# Patient Record
Sex: Male | Born: 1955 | Race: White | Hispanic: No | Marital: Married | Smoking: Never smoker
Health system: Southern US, Academic
[De-identification: ages and names within clinical notes are randomized; demographics above are authoritative.]

## PROBLEM LIST (undated history)

## (undated) DIAGNOSIS — E782 Mixed hyperlipidemia: Secondary | ICD-10-CM

## (undated) DIAGNOSIS — F419 Anxiety disorder, unspecified: Secondary | ICD-10-CM

## (undated) DIAGNOSIS — R7309 Other abnormal glucose: Secondary | ICD-10-CM

## (undated) DIAGNOSIS — M1990 Unspecified osteoarthritis, unspecified site: Secondary | ICD-10-CM

## (undated) DIAGNOSIS — M549 Dorsalgia, unspecified: Secondary | ICD-10-CM

## (undated) DIAGNOSIS — F32A Depression, unspecified: Secondary | ICD-10-CM

## (undated) DIAGNOSIS — Z9229 Personal history of other drug therapy: Secondary | ICD-10-CM

## (undated) DIAGNOSIS — G8929 Other chronic pain: Secondary | ICD-10-CM

## (undated) HISTORY — DX: Dorsalgia, unspecified: M54.9

## (undated) HISTORY — DX: Depression, unspecified: F32.A

## (undated) HISTORY — DX: Unspecified osteoarthritis, unspecified site: M19.90

## (undated) HISTORY — DX: Other abnormal glucose: R73.09

## (undated) HISTORY — PX: HX BACK SURGERY: SHX140

## (undated) HISTORY — DX: Personal history of other drug therapy: Z92.29

## (undated) HISTORY — PX: HX KNEE REPLACMENT: SHX125

## (undated) HISTORY — DX: Anxiety disorder, unspecified: F41.9

## (undated) HISTORY — PX: HX CARPAL TUNNEL RELEASE: SHX101

## (undated) HISTORY — DX: Other chronic pain: G89.29

## (undated) HISTORY — DX: Mixed hyperlipidemia: E78.2

## (undated) HISTORY — PX: COLONOSCOPY W/ POLYPECTOMY: SHX1380

## (undated) HISTORY — PX: HX TONSILLECTOMY: SHX27

---

## 2018-05-20 ENCOUNTER — Ambulatory Visit (HOSPITAL_COMMUNITY): Payer: Self-pay | Admitting: Physician Assistant

## 2019-12-08 IMAGING — CR XRAY SHOULDER COMPLETE RT
1 series · 5 of 5 positions shown · non-contrast
Comparison: None available.

﻿EXAM:  XRAY SHOULDER COMPLETE RT
INDICATION: Right shoulder pain.

[Series 1: view not recorded · 0.17mm/px · 5 of 5 slices shown]
[im 1/5]
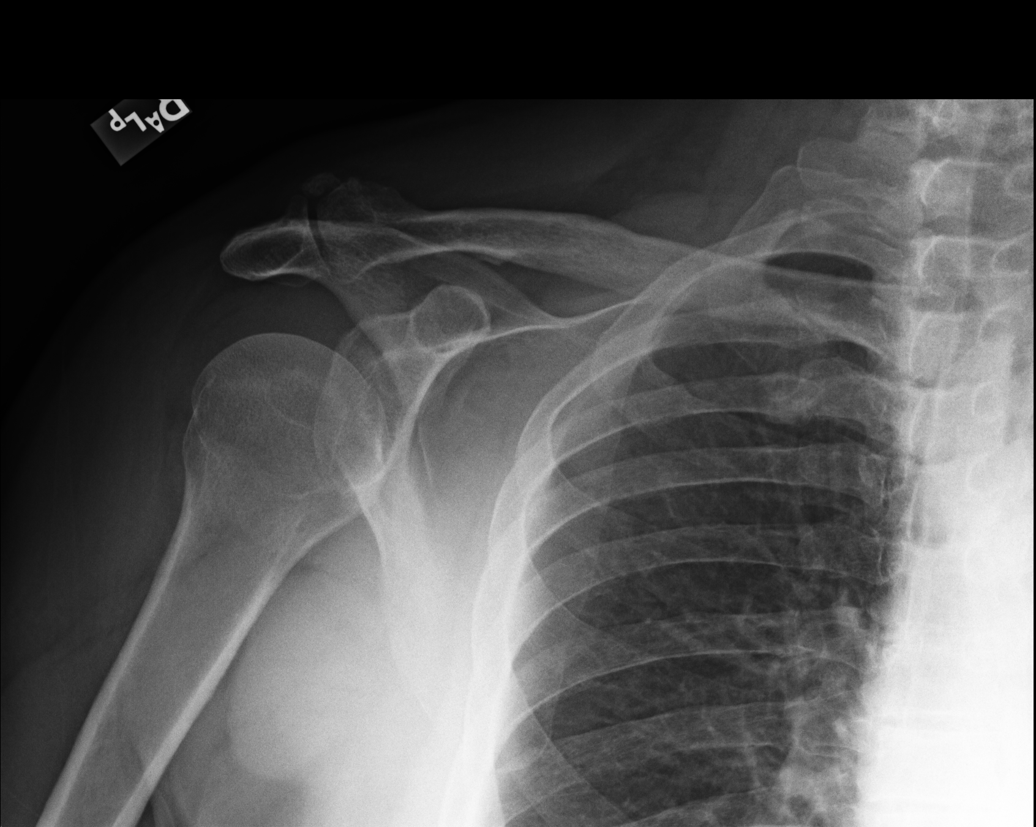
[im 2/5]
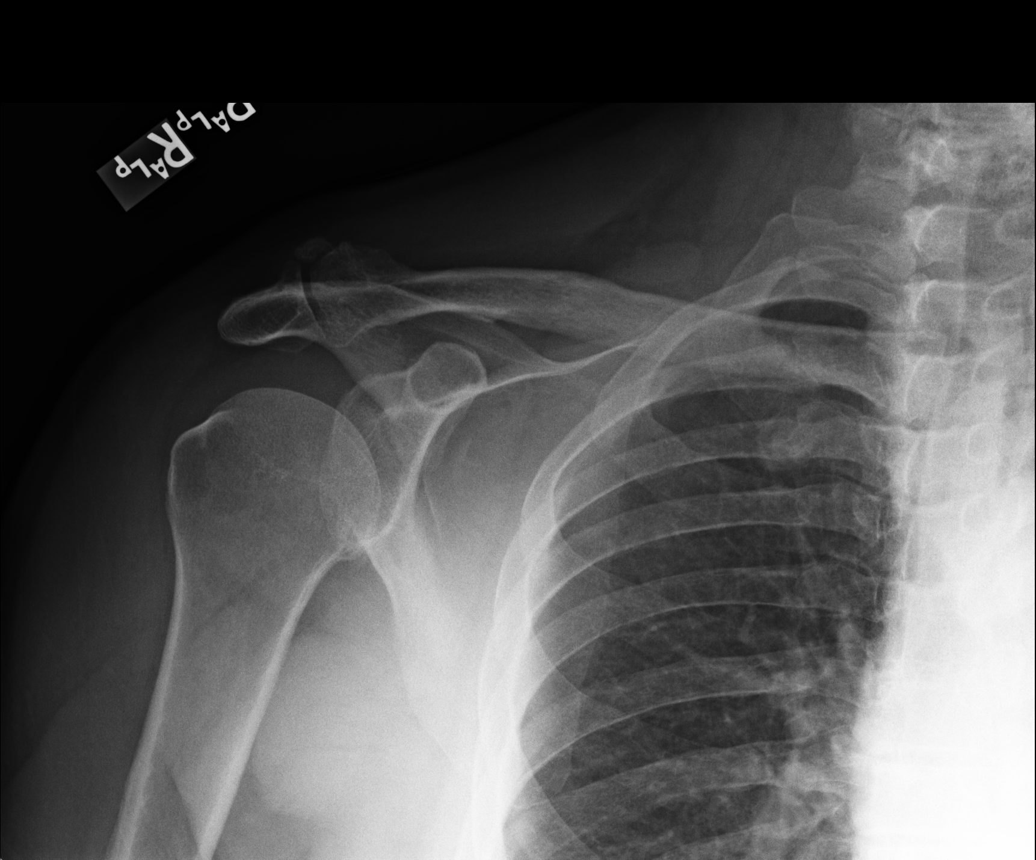
[im 3/5]
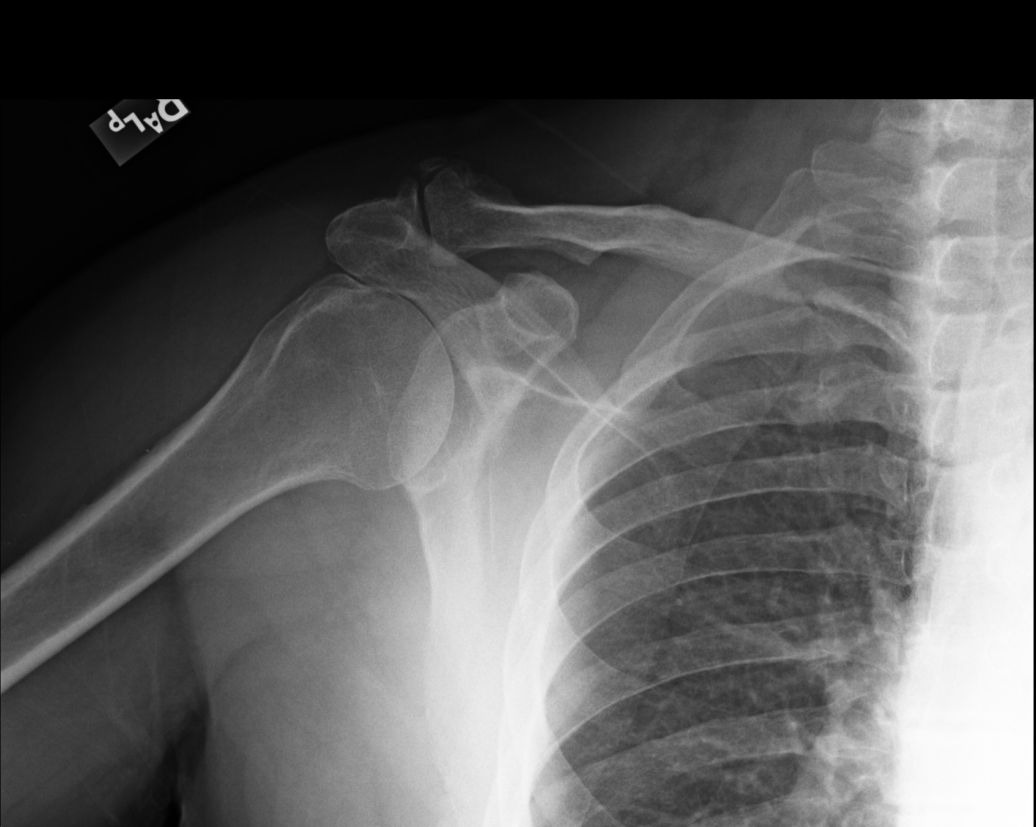
[im 4/5]
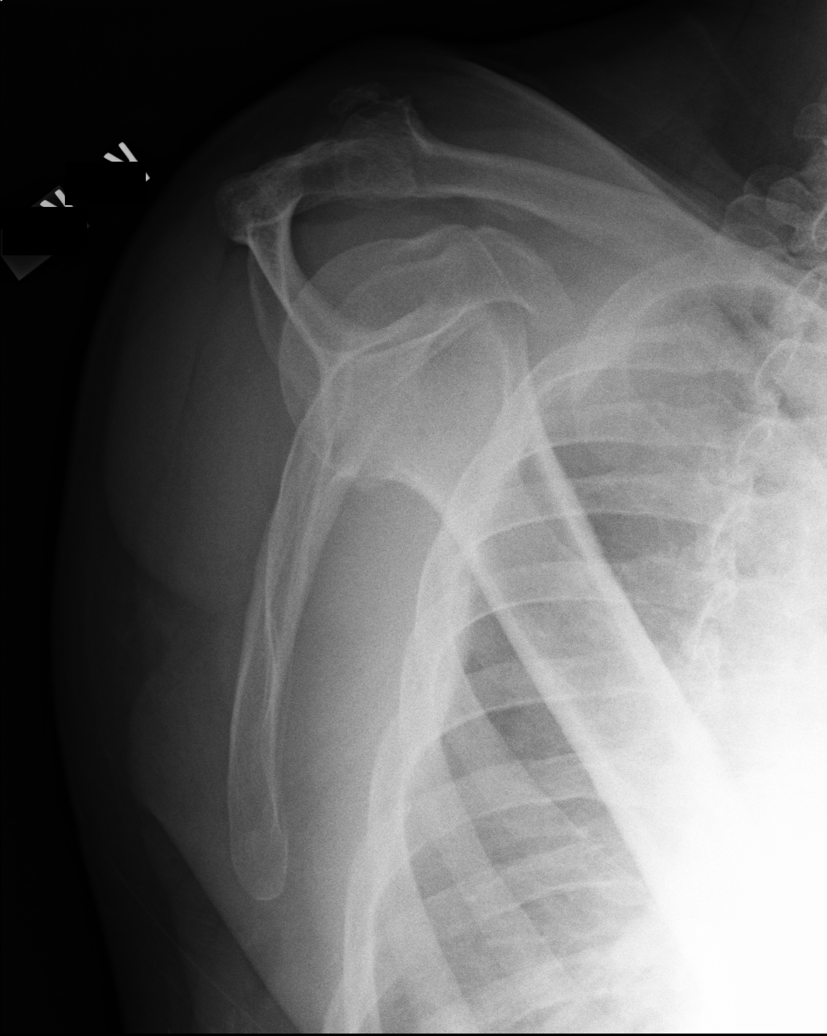
[im 5/5]
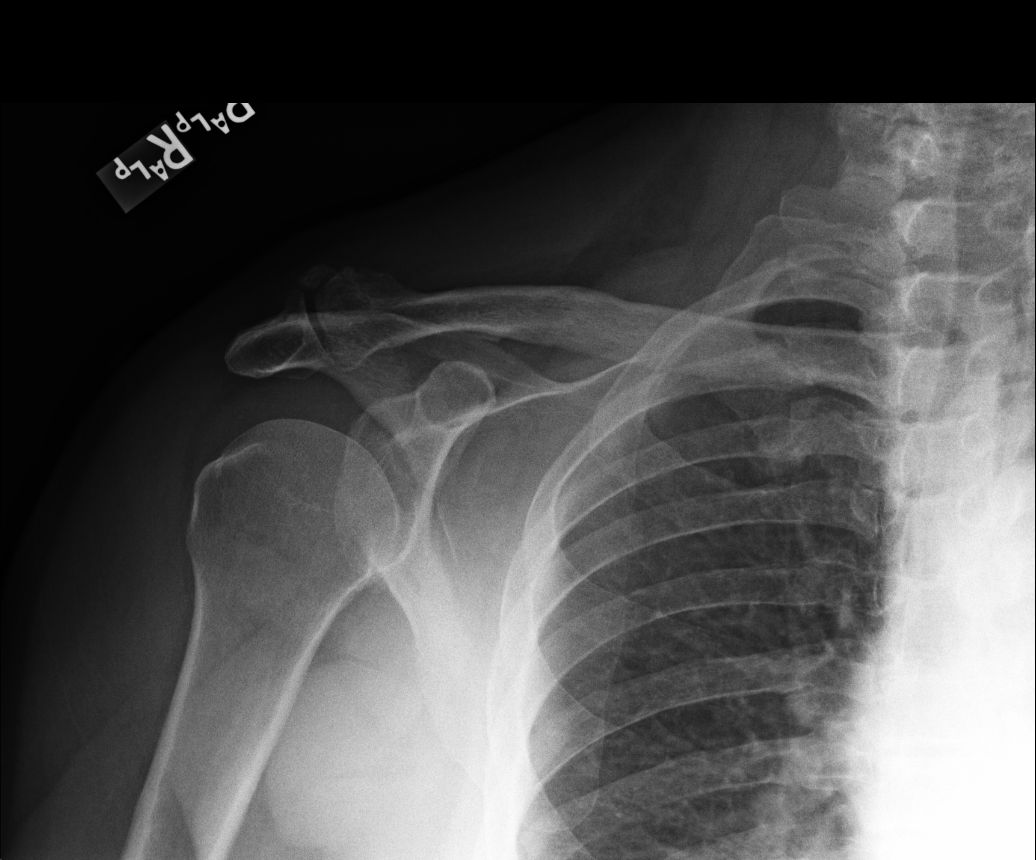

[5 of 5 positions shown; findings below may reference images not displayed]

FINDINGS: There is no acute fracture or subluxation. Glenohumeral articulation is well maintained. There is advanced acromioclavicular joint osteoarthritis. Visualized right lung is clear. Surrounding soft tissues are unremarkable.
IMPRESSION: Advanced acromioclavicular joint osteoarthritis.

## 2021-03-28 IMAGING — MR MRI SHOULDER RT W/O CONTRAST
6 series · 34 of 40 positions shown · IV contrast (gadolinium)
Comparison: Radiographs dated 12/08/2019.

﻿EXAM:  59330   MRI SHOULDER RT W/O CONTRAST
INDICATION: Pain for several months.
TECHNIQUE: Multiplanar multisequential MRI of the right shoulder joint was performed without gadolinium contrast.

[Series 6: T1 · oblique · right · 4.0mm · 0.31mm/px · 7 of 22 slices shown]
[im 1/22]
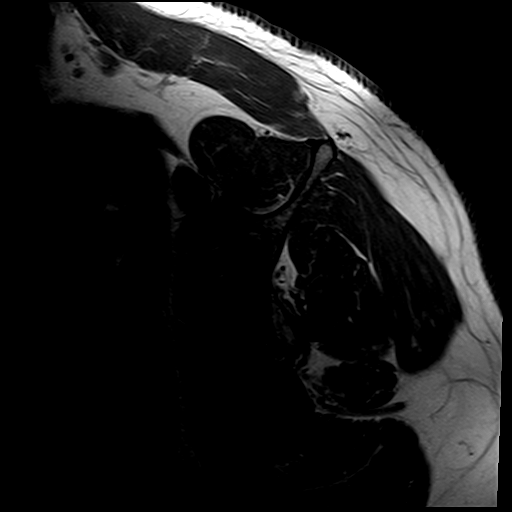
[im 4/22]
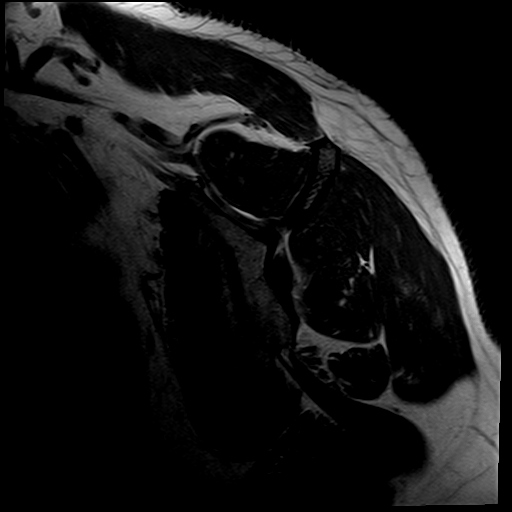
[im 8/22]
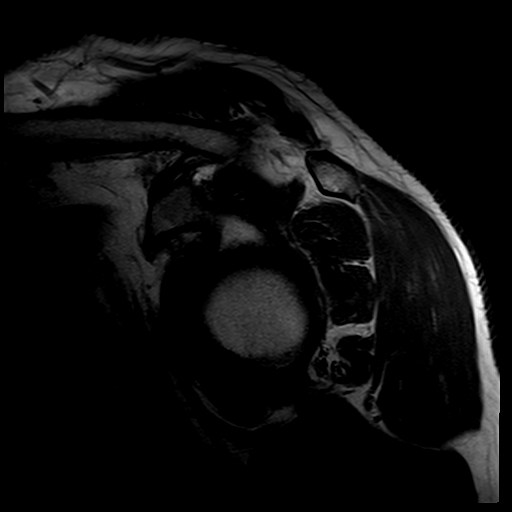
[im 11/22]
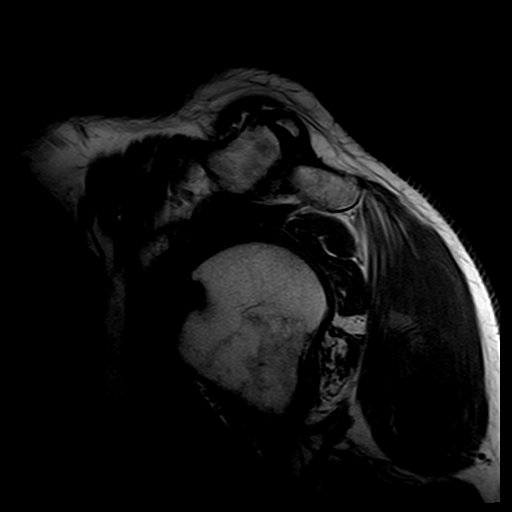
[im 15/22]
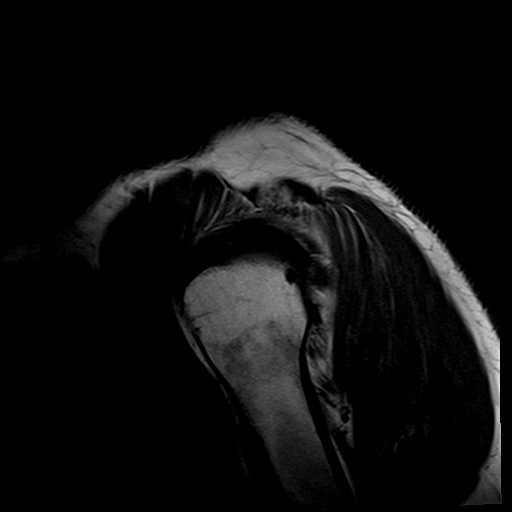
[im 18/22]
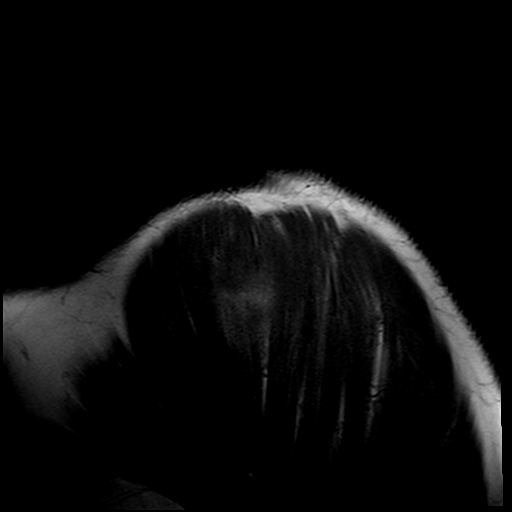
[im 22/22]
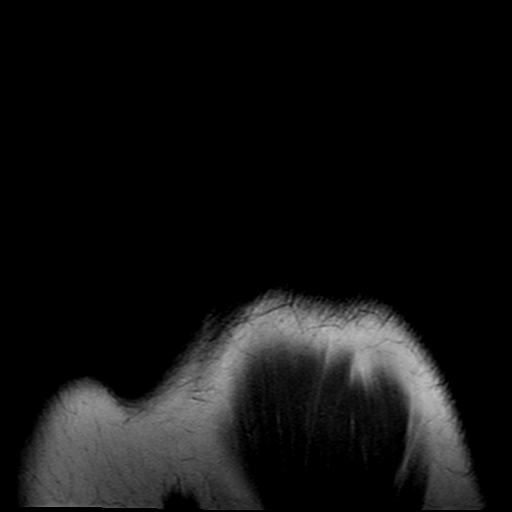

[Series 7: STIR · oblique · right · 3.5mm · 0.47mm/px · 6 of 18 slices shown]
[im 1/18]
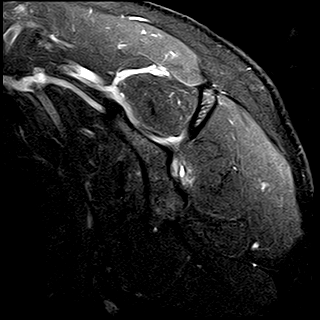
[im 4/18]
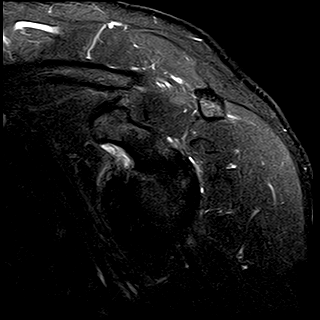
[im 7/18]
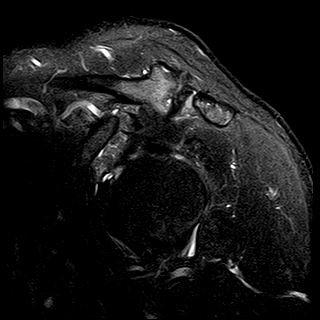
[im 11/18]
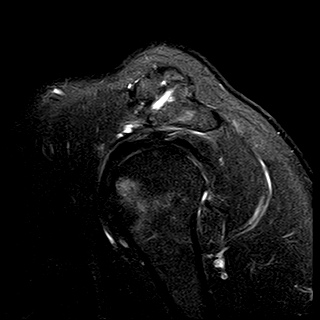
[im 14/18]
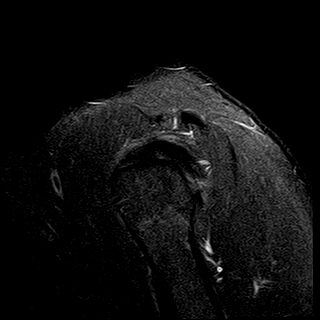
[im 18/18]
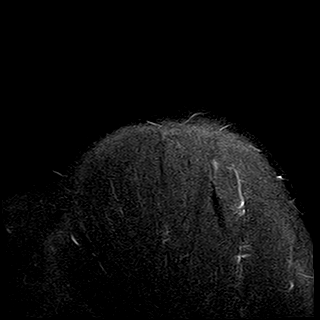

[Series 8: PD fat-sat · axial · right · 4.5mm · 0.50mm/px · z∈[-84,+1]mm · 6 of 18 slices shown (1 of 2)]
[im 1/18]
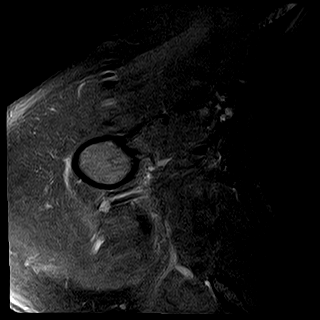
[im 4/18]
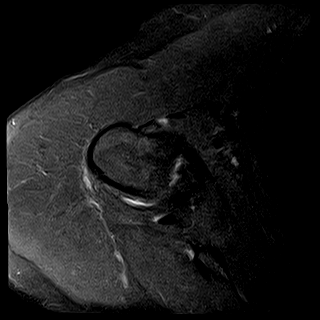
[im 7/18]
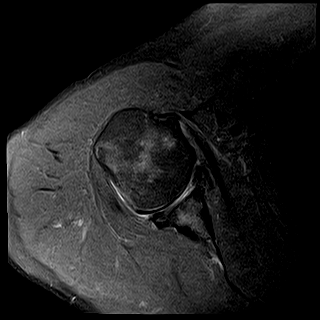
[im 11/18]
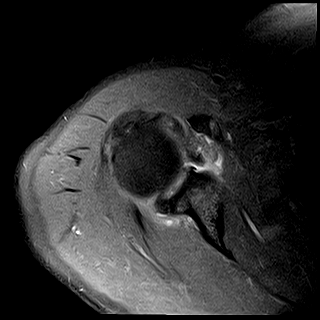
[im 14/18]
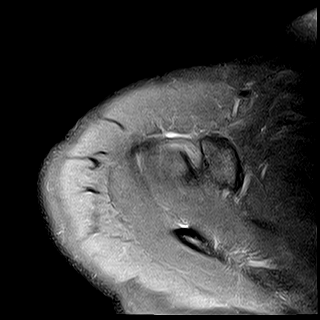
[im 18/18]
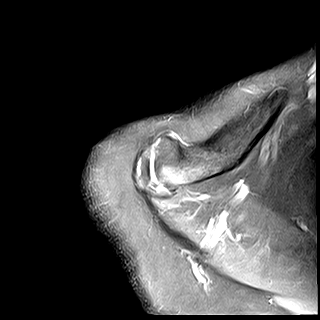

[Series 9: T2 fat-sat · axial · right · 4.0mm · 0.42mm/px · z∈[-94,+10]mm · 8 of 24 slices shown]
[im 1/24]
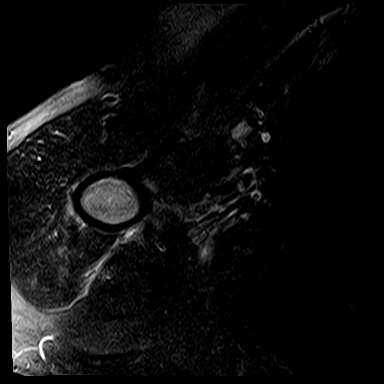
[im 4/24]
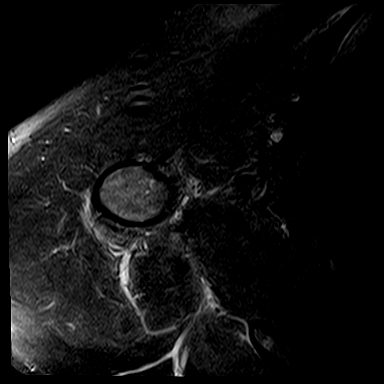
[im 7/24]
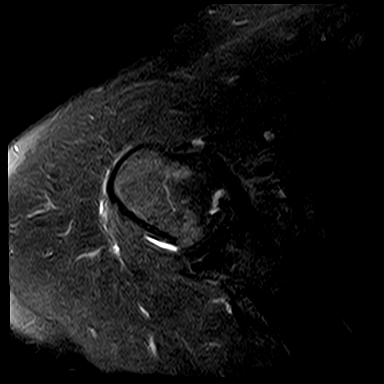
[im 10/24]
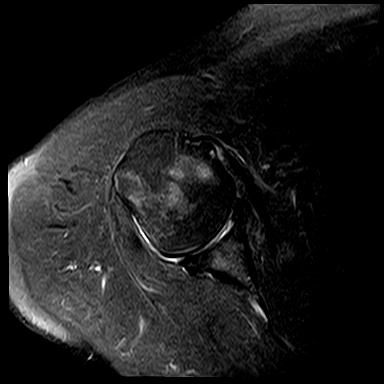
[im 14/24]
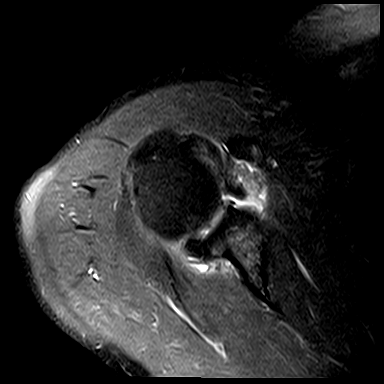
[im 17/24]
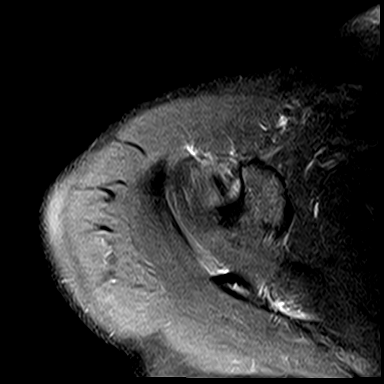
[im 20/24]
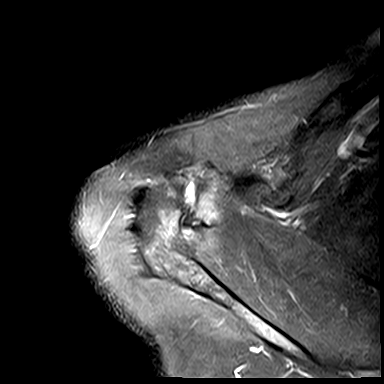
[im 24/24]
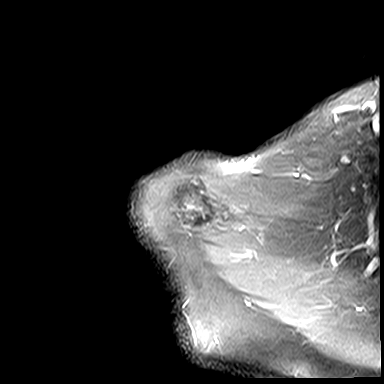

[Series 11: PD fat-sat · oblique · right · 3.5mm · 0.47mm/px · 6 of 18 slices shown (2 of 2)]
[im 1/18]
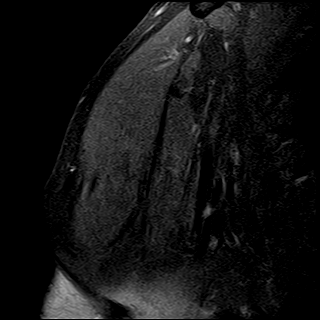
[im 4/18]
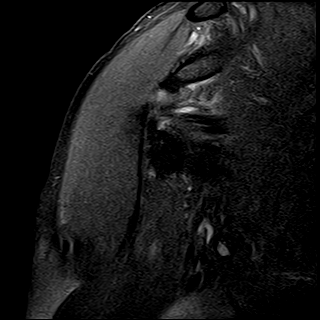
[im 7/18]
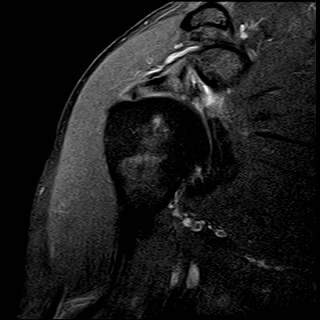
[im 11/18]
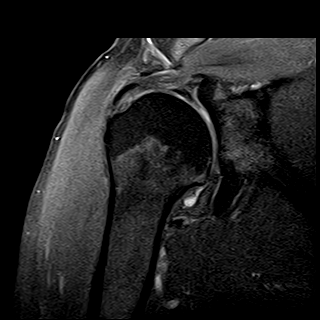
[im 14/18]
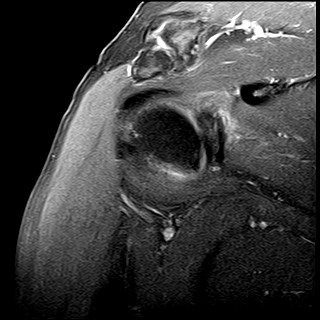
[im 18/18]
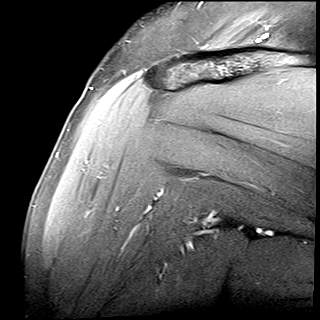

[Series 12: cor (id) · oblique · right · 4.0mm · 0.50mm/px · 1 of 23 slices shown]
[im 1/23]
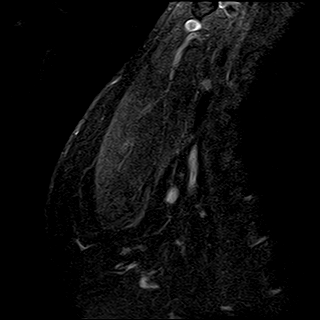

[34 of 40 positions shown; findings below may reference images not displayed]

FINDINGS: There is mild supraspinatus tendinopathy. Infraspinatus, teres minor and subscapularis muscles and tendons are within normal limits in morphology and signal intensity. Long head of biceps tendon is chronically torn and retracted. There are complex superior and posterior labral tears. There is mild glenohumeral articular cartilage thinning. There is advanced acromioclavicular joint osteoarthritis. There is trace fluid within the subacromial/subdeltoid bursa. Muscle bulk and bone marrow signal intensity are normal. No mass is seen along the course of the suprascapular nerve, within the spinoglenoid notch or within the quadrilateral space.
IMPRESSION: 1. Chronically torn and retracted long head of biceps tendon. 

2. Complex superior and posterior labral tears. 

3. Mild supraspinatus tendinopathy. 

4. Advanced acromioclavicular joint osteoarthritis.

## 2021-04-14 ENCOUNTER — Encounter (RURAL_HEALTH_CENTER): Payer: Self-pay | Admitting: Physician Assistant

## 2021-04-14 DIAGNOSIS — R7309 Other abnormal glucose: Secondary | ICD-10-CM | POA: Insufficient documentation

## 2021-04-14 DIAGNOSIS — E782 Mixed hyperlipidemia: Secondary | ICD-10-CM | POA: Insufficient documentation

## 2021-04-14 DIAGNOSIS — M1990 Unspecified osteoarthritis, unspecified site: Secondary | ICD-10-CM | POA: Insufficient documentation

## 2021-04-14 DIAGNOSIS — M159 Polyosteoarthritis, unspecified: Secondary | ICD-10-CM

## 2021-04-14 DIAGNOSIS — F419 Anxiety disorder, unspecified: Secondary | ICD-10-CM | POA: Insufficient documentation

## 2021-05-18 ENCOUNTER — Other Ambulatory Visit (RURAL_HEALTH_CENTER): Payer: Self-pay

## 2021-05-18 ENCOUNTER — Other Ambulatory Visit: Payer: Self-pay

## 2021-05-18 ENCOUNTER — Encounter (RURAL_HEALTH_CENTER): Payer: Self-pay | Admitting: Physician Assistant

## 2021-05-18 ENCOUNTER — Ambulatory Visit (RURAL_HEALTH_CENTER): Payer: Medicare PPO | Attending: Physician Assistant | Admitting: Physician Assistant

## 2021-05-18 VITALS — BP 120/80 | HR 77 | Temp 98.1°F | Ht 65.0 in | Wt 181.0 lb

## 2021-05-18 DIAGNOSIS — M549 Dorsalgia, unspecified: Secondary | ICD-10-CM | POA: Insufficient documentation

## 2021-05-18 DIAGNOSIS — Z79899 Other long term (current) drug therapy: Secondary | ICD-10-CM | POA: Insufficient documentation

## 2021-05-18 DIAGNOSIS — G8929 Other chronic pain: Secondary | ICD-10-CM | POA: Insufficient documentation

## 2021-05-18 DIAGNOSIS — R059 Cough, unspecified: Secondary | ICD-10-CM | POA: Insufficient documentation

## 2021-05-18 DIAGNOSIS — R0981 Nasal congestion: Secondary | ICD-10-CM | POA: Insufficient documentation

## 2021-05-18 DIAGNOSIS — M1990 Unspecified osteoarthritis, unspecified site: Secondary | ICD-10-CM | POA: Insufficient documentation

## 2021-05-18 DIAGNOSIS — F32A Depression, unspecified: Secondary | ICD-10-CM | POA: Insufficient documentation

## 2021-05-18 DIAGNOSIS — M159 Polyosteoarthritis, unspecified: Secondary | ICD-10-CM

## 2021-05-18 DIAGNOSIS — M545 Low back pain, unspecified: Secondary | ICD-10-CM

## 2021-05-18 DIAGNOSIS — F419 Anxiety disorder, unspecified: Secondary | ICD-10-CM | POA: Insufficient documentation

## 2021-05-18 DIAGNOSIS — I10 Essential (primary) hypertension: Secondary | ICD-10-CM | POA: Insufficient documentation

## 2021-05-18 MED ORDER — AZITHROMYCIN 250 MG TABLET
ORAL_TABLET | ORAL | 0 refills | Status: DC
Start: 2021-05-18 — End: 2021-07-18

## 2021-05-18 MED ORDER — SIMVASTATIN 40 MG TABLET
40.0000 mg | ORAL_TABLET | Freq: Every evening | ORAL | 1 refills | Status: DC
Start: 2021-05-18 — End: 2021-06-21

## 2021-05-18 MED ORDER — HYDROCODONE 7.5 MG-ACETAMINOPHEN 325 MG TABLET
1.0000 | ORAL_TABLET | Freq: Three times a day (TID) | ORAL | 0 refills | Status: DC | PRN
Start: 2021-05-18 — End: 2021-06-21

## 2021-05-18 MED ORDER — LISINOPRIL 10 MG TABLET
10.0000 mg | ORAL_TABLET | Freq: Every day | ORAL | 1 refills | Status: DC
Start: 2021-05-18 — End: 2021-08-22

## 2021-05-18 MED ORDER — TRIAMCINOLONE ACETONIDE 40 MG/ML SUSPENSION FOR INJECTION
40.0000 mg | INTRAMUSCULAR | Status: AC
Start: 2021-05-18 — End: 2021-05-18
  Administered 2021-05-18: 40 mg via INTRAMUSCULAR

## 2021-05-18 NOTE — Nursing Note (Signed)
Patient is here for 1 month follow up with medication refills.
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
## 2021-05-26 ENCOUNTER — Other Ambulatory Visit: Payer: Self-pay

## 2021-05-26 ENCOUNTER — Ambulatory Visit (RURAL_HEALTH_CENTER): Payer: Medicare HMO | Attending: Physician Assistant | Admitting: Physician Assistant

## 2021-05-26 DIAGNOSIS — Z0000 Encounter for general adult medical examination without abnormal findings: Secondary | ICD-10-CM

## 2021-05-26 DIAGNOSIS — Z538 Procedure and treatment not carried out for other reasons: Secondary | ICD-10-CM | POA: Insufficient documentation

## 2021-06-01 DIAGNOSIS — S46211A Strain of muscle, fascia and tendon of other parts of biceps, right arm, initial encounter: Secondary | ICD-10-CM | POA: Insufficient documentation

## 2021-06-21 ENCOUNTER — Other Ambulatory Visit: Payer: Self-pay

## 2021-06-21 ENCOUNTER — Ambulatory Visit (RURAL_HEALTH_CENTER): Payer: Medicare HMO | Attending: Physician Assistant | Admitting: Physician Assistant

## 2021-06-21 ENCOUNTER — Encounter (RURAL_HEALTH_CENTER): Payer: Self-pay | Admitting: Physician Assistant

## 2021-06-21 VITALS — BP 124/78 | HR 73 | Temp 98.7°F | Ht 64.0 in | Wt 178.0 lb

## 2021-06-21 DIAGNOSIS — G479 Sleep disorder, unspecified: Secondary | ICD-10-CM | POA: Insufficient documentation

## 2021-06-21 DIAGNOSIS — M549 Dorsalgia, unspecified: Secondary | ICD-10-CM | POA: Insufficient documentation

## 2021-06-21 DIAGNOSIS — G8929 Other chronic pain: Secondary | ICD-10-CM | POA: Insufficient documentation

## 2021-06-21 DIAGNOSIS — F419 Anxiety disorder, unspecified: Secondary | ICD-10-CM | POA: Insufficient documentation

## 2021-06-21 DIAGNOSIS — I10 Essential (primary) hypertension: Secondary | ICD-10-CM | POA: Insufficient documentation

## 2021-06-21 DIAGNOSIS — F515 Nightmare disorder: Secondary | ICD-10-CM | POA: Insufficient documentation

## 2021-06-21 MED ORDER — CLONAZEPAM 1 MG TABLET
1.0000 mg | ORAL_TABLET | Freq: Every evening | ORAL | 2 refills | Status: DC
Start: 2021-06-21 — End: 2021-07-18

## 2021-06-21 MED ORDER — SIMVASTATIN 40 MG TABLET
40.0000 mg | ORAL_TABLET | Freq: Every evening | ORAL | 1 refills | Status: DC
Start: 2021-06-21 — End: 2021-10-17

## 2021-06-21 MED ORDER — HYDROCODONE 7.5 MG-ACETAMINOPHEN 325 MG TABLET
1.0000 | ORAL_TABLET | Freq: Three times a day (TID) | ORAL | 0 refills | Status: DC | PRN
Start: 2021-06-21 — End: 2021-07-18

## 2021-06-21 NOTE — Nursing Note (Signed)
Patient here today for refills. Patient voiced no new problems.
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
## 2021-06-26 NOTE — Progress Notes (Signed)
[facility]  INTERNAL MEDICINE, [facility]  Operated by [facility]  Progress Note    Name: Gregory Case MRN:  [id number]   Date: 05/26/2021 Age: 66 y.o.         Encounter Date: 05/26/2021   PCP: Eyvonne Mechanic, PA-C     PT WAS A NO SHOW FOR THIS VISIT  NO CHARGE        SUBJECTIVE:   Gregory Case is a 66 y.o. male for presenting for annual Medicare Wellness Visit.          I have reviewed and updated as appropriate the past medical, family and social history. 05/26/2021 as summarized below:  Past Medical History:   Diagnosis Date   . Abnormal glucose level    . Anxiety    . Chronic back pain    . COVID-19 vaccine series completed    . Depression    . Mixed hyperlipidemia    . Osteoarthritis      Past Surgical History:   Procedure Laterality Date   . Colonoscopy w/ polypectomy     . Hx back surgery     . Hx carpal tunnel release     . Hx knee replacment Right    . Hx tonsillectomy       Current Outpatient Medications   Medication Sig   . azithromycin (ZITHROMAX) 250 mg Oral Tablet Take 500 mg (2 tab) on day 1; take 250 mg (1 tab) on days 2-5. (Patient not taking: Reported on 06/21/2021)   . clonazePAM (KLONOPIN) 1 mg Oral Tablet Take 1 Tablet (1 mg total) by mouth Every night   . FLUoxetine (PROZAC) 10 mg Oral Capsule Take 1 Capsule (10 mg total) by mouth Once a day   . HYDROcodone-acetaminophen (NORCO) 7.5-325 mg Oral Tablet Take 1 Tablet by mouth Three times a day as needed for Pain for up to 30 days   . lisinopriL (PRINIVIL) 10 mg Oral Tablet Take 1 Tablet (10 mg total) by mouth Once a day   . omega 3-dha-epa-fish oil 300 mg (120 mg- 180mg )-1,000 mg Oral Capsule Take 1 Capsule by mouth Once a day   . simvastatin (ZOCOR) 40 mg Oral Tablet Take 1 Tablet (40 mg total) by mouth Every night     Family Medical History:     Problem Relation (Age of Onset)    Coronary Artery Disease Mother    Diabetes type II Mother    Hypertension (High Blood Pressure) Other           Social History     Socioeconomic History   . Marital status: Married   Tobacco Use   . Smoking status: Never   . Smokeless tobacco: Never   Substance and Sexual Activity   . Alcohol use: Yes     Comment: few times a week   . Drug use: Never   . Sexual activity: Yes     Partners: Female        List of Current Health Care Providers   Care Team     PCP     Name Type Specialty Phone Number    , Eyvonne Mechanic Physician Assistant PHYSICIAN ASSISTANT [phone]          Care Team     No care team found                  Health Maintenance   Topic Date Due   . Hepatitis C screening  Never done   . Shingles Vaccine (1 of 2) Never done   . Pneumococcal Vaccination, Age 32+ (1 - PCV) Never done   . Adult Tdap-Td (2 - Td or Tdap) 07/17/2023   . Colonoscopy  06/29/2029   . Influenza Vaccine  Completed   . Covid-19 Vaccine  Completed   . Meningococcal Vaccine  Aged Out   . Depression Screening  Discontinued     Medicare Wellness Assessment      Advance Directives (optional)                  Activities of Daily Living                   Urinary Incontinence Screen (Women >=65 only)       Cognitive Function Screen (1=Yes, 0=No)                                                   Hearing Screen       Fall Risk Screen       Vision Screen           Depression Screen       Pain Score        Substance Use-Abuse Screening   Tobacco Use       Alcohol use       Prescription Drug Use             Illicit Drug Use                ROS:  ROS     OBJECTIVE:   There were no vitals filed for this visit.        PHYSICAL EXAM:  Physical Exam    ASSESSMENT/PLAN:      Identified Risk Factors/ Recommended Actions   No diagnosis found.              Opioid use plan of care:         Opioids use:       Written Prevention Plan reviewed and shared in writing with the patient (See Patient Instructions in After Visit Summary).       The patient has been educated about risk factors and recommended preventive care. Written Prevention Plan completed/ updated  and given to patient (see After Visit Summary).    Follow up: No follow-ups on file.    Rosalio Loud    [facility] Primary [facility]      This note was partially created using MModal Fluency Direct system (voice recognition software) and is inherently subject to errors including those of syntax and "sound-alike" substitutions which may escape proofreading.  In such instances, original meaning may be extrapolated by contextual derivation.

## 2021-07-18 ENCOUNTER — Ambulatory Visit (RURAL_HEALTH_CENTER): Payer: Medicare HMO | Attending: Physician Assistant | Admitting: Physician Assistant

## 2021-07-18 ENCOUNTER — Other Ambulatory Visit: Payer: Self-pay

## 2021-07-18 ENCOUNTER — Encounter (RURAL_HEALTH_CENTER): Payer: Self-pay | Admitting: Physician Assistant

## 2021-07-18 VITALS — BP 132/76 | HR 67 | Temp 96.6°F | Ht 64.0 in | Wt 179.0 lb

## 2021-07-18 DIAGNOSIS — G8929 Other chronic pain: Secondary | ICD-10-CM | POA: Insufficient documentation

## 2021-07-18 DIAGNOSIS — I10 Essential (primary) hypertension: Secondary | ICD-10-CM | POA: Insufficient documentation

## 2021-07-18 DIAGNOSIS — R7309 Other abnormal glucose: Secondary | ICD-10-CM | POA: Insufficient documentation

## 2021-07-18 DIAGNOSIS — M545 Low back pain, unspecified: Secondary | ICD-10-CM | POA: Insufficient documentation

## 2021-07-18 DIAGNOSIS — F419 Anxiety disorder, unspecified: Secondary | ICD-10-CM | POA: Insufficient documentation

## 2021-07-18 DIAGNOSIS — G478 Other sleep disorders: Secondary | ICD-10-CM | POA: Insufficient documentation

## 2021-07-18 MED ORDER — FLUOXETINE 10 MG CAPSULE
10.0000 mg | ORAL_CAPSULE | Freq: Every day | ORAL | 1 refills | Status: DC
Start: 2021-07-18 — End: 2021-08-17

## 2021-07-18 MED ORDER — CLONAZEPAM 1 MG TABLET
1.0000 mg | ORAL_TABLET | Freq: Every evening | ORAL | 2 refills | Status: DC
Start: 2021-07-18 — End: 2021-08-17

## 2021-07-18 MED ORDER — HYDROCODONE 7.5 MG-ACETAMINOPHEN 325 MG TABLET
1.0000 | ORAL_TABLET | Freq: Three times a day (TID) | ORAL | 0 refills | Status: DC | PRN
Start: 2021-07-18 — End: 2021-08-17

## 2021-07-18 NOTE — Nursing Note (Signed)
Patient is here for routine 1 month follow up for medication refills.
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
## 2021-08-15 ENCOUNTER — Ambulatory Visit (RURAL_HEALTH_CENTER): Payer: Self-pay | Admitting: Physician Assistant

## 2021-08-17 ENCOUNTER — Other Ambulatory Visit: Payer: Self-pay

## 2021-08-17 ENCOUNTER — Other Ambulatory Visit (INDEPENDENT_AMBULATORY_CARE_PROVIDER_SITE_OTHER): Payer: Self-pay | Admitting: Physician Assistant

## 2021-08-17 ENCOUNTER — Encounter (INDEPENDENT_AMBULATORY_CARE_PROVIDER_SITE_OTHER): Payer: Self-pay | Admitting: Physician Assistant

## 2021-08-17 ENCOUNTER — Ambulatory Visit: Payer: Medicare (Managed Care) | Attending: Physician Assistant | Admitting: Physician Assistant

## 2021-08-17 VITALS — BP 138/88 | HR 52 | Temp 97.2°F | Ht 64.0 in | Wt 171.0 lb

## 2021-08-17 DIAGNOSIS — M545 Low back pain, unspecified: Secondary | ICD-10-CM | POA: Insufficient documentation

## 2021-08-17 DIAGNOSIS — M25512 Pain in left shoulder: Secondary | ICD-10-CM | POA: Insufficient documentation

## 2021-08-17 DIAGNOSIS — M159 Polyosteoarthritis, unspecified: Secondary | ICD-10-CM | POA: Insufficient documentation

## 2021-08-17 DIAGNOSIS — I10 Essential (primary) hypertension: Secondary | ICD-10-CM | POA: Insufficient documentation

## 2021-08-17 DIAGNOSIS — R7309 Other abnormal glucose: Secondary | ICD-10-CM | POA: Insufficient documentation

## 2021-08-17 DIAGNOSIS — F419 Anxiety disorder, unspecified: Secondary | ICD-10-CM | POA: Insufficient documentation

## 2021-08-17 DIAGNOSIS — G8929 Other chronic pain: Secondary | ICD-10-CM | POA: Insufficient documentation

## 2021-08-17 DIAGNOSIS — M79642 Pain in left hand: Secondary | ICD-10-CM | POA: Insufficient documentation

## 2021-08-17 LAB — CBC WITH DIFF
BASOPHIL #: 0 10*3/uL (ref 0.00–0.30)
BASOPHIL %: 1 % (ref 0–3)
EOSINOPHIL #: 0.1 10*3/uL (ref 0.00–0.80)
EOSINOPHIL %: 1 % (ref 0–7)
HCT: 43.3 % (ref 42.0–51.0)
HGB: 14.7 g/dL (ref 13.5–18.0)
LYMPHOCYTE #: 2.1 10*3/uL (ref 1.10–5.00)
LYMPHOCYTE %: 28 % (ref 25–45)
MCH: 29.4 pg (ref 27.0–32.0)
MCHC: 34 g/dL (ref 32.0–36.0)
MCV: 86.4 fL (ref 78.0–99.0)
MONOCYTE #: 0.7 10*3/uL (ref 0.00–1.30)
MONOCYTE %: 9 % (ref 0–12)
MPV: 9.7 fL (ref 7.4–10.4)
NEUTROPHIL #: 4.8 10*3/uL (ref 1.80–8.40)
NEUTROPHIL %: 62 % (ref 40–76)
PLATELETS: 219 10*3/uL (ref 140–440)
RBC: 5.01 10*6/uL (ref 4.20–6.00)
RDW: 13.3 % (ref 11.6–14.8)
WBC: 7.7 10*3/uL (ref 4.0–10.5)
WBCS UNCORRECTED: 7.7 10*3/uL

## 2021-08-17 LAB — COMPREHENSIVE METABOLIC PNL, FASTING
ALBUMIN/GLOBULIN RATIO: 1.7 — ABNORMAL HIGH (ref 0.8–1.4)
ALBUMIN: 4.8 g/dL (ref 3.5–5.7)
ALKALINE PHOSPHATASE: 38 U/L (ref 34–104)
ALT (SGPT): 16 U/L (ref 7–52)
ANION GAP: 8 mmol/L — ABNORMAL LOW (ref 10–20)
AST (SGOT): 30 U/L (ref 13–39)
BILIRUBIN TOTAL: 0.6 mg/dL (ref 0.3–1.2)
BUN/CREA RATIO: 8 (ref 6–22)
BUN: 7 mg/dL (ref 7–25)
CALCIUM, CORRECTED: 8.9 mg/dL (ref 8.9–10.8)
CALCIUM: 9.7 mg/dL (ref 8.6–10.3)
CHLORIDE: 104 mmol/L (ref 98–107)
CO2 TOTAL: 26 mmol/L (ref 21–31)
CREATININE: 0.89 mg/dL (ref 0.60–1.30)
ESTIMATED GFR: 94 mL/min/{1.73_m2} (ref >59)
GLOBULIN: 2.9 (ref 2.9–5.4)
GLUCOSE: 96 mg/dL (ref 74–109)
OSMOLALITY, CALCULATED: 274 mOsm/kg (ref 270–290)
POTASSIUM: 4.3 mmol/L (ref 3.5–5.1)
PROTEIN TOTAL: 7.7 g/dL (ref 6.4–8.9)
SODIUM: 138 mmol/L (ref 136–145)

## 2021-08-17 LAB — SEDIMENTATION RATE: ERYTHROCYTE SEDIMENTATION RATE (ESR): 9 mm/hr (ref <20)

## 2021-08-17 LAB — URIC ACID: URIC ACID: 6 mg/dL (ref 2.3–7.6)

## 2021-08-17 MED ORDER — CLONAZEPAM 1 MG TABLET
1.0000 mg | ORAL_TABLET | Freq: Every evening | ORAL | 2 refills | Status: DC
Start: 2021-08-17 — End: 2021-11-14

## 2021-08-17 MED ORDER — FLUOXETINE 10 MG CAPSULE
10.0000 mg | ORAL_CAPSULE | Freq: Every day | ORAL | 1 refills | Status: DC
Start: 2021-08-17 — End: 2022-02-08

## 2021-08-17 MED ORDER — HYDROCODONE 7.5 MG-ACETAMINOPHEN 325 MG TABLET
1.0000 | ORAL_TABLET | Freq: Three times a day (TID) | ORAL | 0 refills | Status: DC | PRN
Start: 2021-08-17 — End: 2021-09-14

## 2021-08-17 MED ORDER — TRIAMCINOLONE ACETONIDE 40 MG/ML SUSPENSION FOR INJECTION
40.0000 mg | INTRAMUSCULAR | Status: AC
Start: 2021-08-17 — End: 2021-08-17
  Administered 2021-08-17: 40 mg via INTRAMUSCULAR

## 2021-08-17 MED ORDER — DICLOFENAC 20 MG/GRAM/ACTUATION (2 %) TOPICAL SOLN METERED-DOSE PUMP
1.0000 | Freq: Two times a day (BID) | CUTANEOUS | 3 refills | Status: DC
Start: 2021-08-17 — End: 2021-09-14
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
## 2021-08-17 NOTE — Nursing Note (Signed)
Patient is here for routine 1 month follow up for medication refills.

## 2021-08-17 NOTE — Telephone Encounter (Signed)
Patient came in today for routine 1 month follow up for medication refill.

## 2021-08-18 ENCOUNTER — Encounter (INDEPENDENT_AMBULATORY_CARE_PROVIDER_SITE_OTHER): Payer: Medicare (Managed Care) | Admitting: Physician Assistant

## 2021-08-18 ENCOUNTER — Ambulatory Visit (RURAL_HEALTH_CENTER): Payer: Self-pay | Admitting: Physician Assistant

## 2021-08-18 LAB — HGA1C (HEMOGLOBIN A1C WITH EST AVG GLUCOSE): HEMOGLOBIN A1C: 5.7 % (ref 4.0–6.0)

## 2021-08-19 ENCOUNTER — Ambulatory Visit (INDEPENDENT_AMBULATORY_CARE_PROVIDER_SITE_OTHER): Payer: Medicare (Managed Care) | Admitting: Physician Assistant

## 2021-08-19 LAB — HEP-2 SUBSTRATE ANTINUCLEAR ANTIBODIES (ANA), SERUM: ANA INTERPRETATION: NEGATIVE

## 2021-08-19 LAB — RHEUMATOID FACTOR, SERUM: RHEUMATOID FACTOR: <13 IU/mL (ref <=30)

## 2021-08-22 ENCOUNTER — Other Ambulatory Visit (INDEPENDENT_AMBULATORY_CARE_PROVIDER_SITE_OTHER): Payer: Self-pay | Admitting: Physician Assistant

## 2021-08-22 ENCOUNTER — Other Ambulatory Visit (RURAL_HEALTH_CENTER): Payer: Self-pay | Admitting: Physician Assistant

## 2021-08-22 MED ORDER — TRIAMCINOLONE ACETONIDE 0.1 % TOPICAL CREAM
TOPICAL_CREAM | Freq: Two times a day (BID) | CUTANEOUS | 1 refills | Status: AC
Start: 2021-08-22 — End: 2021-08-29

## 2021-08-22 MED ORDER — METHYLPREDNISOLONE 4 MG TABLETS IN A DOSE PACK
ORAL_TABLET | ORAL | 0 refills | Status: DC
Start: 2021-08-22 — End: 2021-09-14

## 2021-08-22 MED ORDER — LISINOPRIL 10 MG TABLET
10.0000 mg | ORAL_TABLET | Freq: Every day | ORAL | 1 refills | Status: DC
Start: 2021-08-22 — End: 2021-12-12

## 2021-09-12 ENCOUNTER — Ambulatory Visit (RURAL_HEALTH_CENTER): Payer: Self-pay | Admitting: Physician Assistant

## 2021-09-14 ENCOUNTER — Other Ambulatory Visit (INDEPENDENT_AMBULATORY_CARE_PROVIDER_SITE_OTHER): Payer: Self-pay | Admitting: Physician Assistant

## 2021-09-14 ENCOUNTER — Encounter (INDEPENDENT_AMBULATORY_CARE_PROVIDER_SITE_OTHER): Payer: Self-pay | Admitting: Physician Assistant

## 2021-09-14 ENCOUNTER — Ambulatory Visit: Payer: Medicare HMO | Attending: Physician Assistant | Admitting: Physician Assistant

## 2021-09-14 ENCOUNTER — Other Ambulatory Visit: Payer: Self-pay

## 2021-09-14 VITALS — BP 114/82 | HR 67 | Temp 97.2°F | Ht 64.0 in | Wt 173.0 lb

## 2021-09-14 DIAGNOSIS — G8929 Other chronic pain: Secondary | ICD-10-CM

## 2021-09-14 DIAGNOSIS — F32A Depression, unspecified: Secondary | ICD-10-CM

## 2021-09-14 DIAGNOSIS — F419 Anxiety disorder, unspecified: Secondary | ICD-10-CM

## 2021-09-14 DIAGNOSIS — I10 Essential (primary) hypertension: Secondary | ICD-10-CM

## 2021-09-14 DIAGNOSIS — M545 Low back pain, unspecified: Secondary | ICD-10-CM

## 2021-09-14 NOTE — Telephone Encounter (Signed)
Patient is in office for routine 1 month follow up for medication refills.
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
## 2021-09-14 NOTE — Nursing Note (Signed)
Patient is here for routine 1 month follow up for medication refills.

## 2021-09-15 ENCOUNTER — Ambulatory Visit (RURAL_HEALTH_CENTER): Payer: Self-pay | Admitting: Physician Assistant

## 2021-09-15 MED ORDER — HYDROCODONE 7.5 MG-ACETAMINOPHEN 325 MG TABLET
1.0000 | ORAL_TABLET | Freq: Three times a day (TID) | ORAL | 0 refills | Status: DC | PRN
Start: 2021-09-15 — End: 2021-10-17

## 2021-09-16 ENCOUNTER — Ambulatory Visit (INDEPENDENT_AMBULATORY_CARE_PROVIDER_SITE_OTHER): Payer: BC Managed Care – PPO | Admitting: Physician Assistant

## 2021-09-22 DIAGNOSIS — F32A Depression, unspecified: Secondary | ICD-10-CM | POA: Insufficient documentation

## 2021-10-10 ENCOUNTER — Ambulatory Visit (RURAL_HEALTH_CENTER): Payer: Self-pay | Admitting: Physician Assistant

## 2021-10-13 ENCOUNTER — Ambulatory Visit (RURAL_HEALTH_CENTER): Payer: Self-pay | Admitting: Physician Assistant

## 2021-10-13 ENCOUNTER — Encounter (INDEPENDENT_AMBULATORY_CARE_PROVIDER_SITE_OTHER): Payer: BC Managed Care – PPO | Admitting: Physician Assistant

## 2021-10-14 ENCOUNTER — Ambulatory Visit (INDEPENDENT_AMBULATORY_CARE_PROVIDER_SITE_OTHER): Payer: BC Managed Care – PPO | Admitting: Physician Assistant

## 2021-10-17 ENCOUNTER — Other Ambulatory Visit (RURAL_HEALTH_CENTER): Payer: Self-pay | Admitting: Family Medicine

## 2021-10-17 ENCOUNTER — Other Ambulatory Visit: Payer: Self-pay

## 2021-10-17 ENCOUNTER — Ambulatory Visit: Payer: Medicare HMO | Attending: Physician Assistant | Admitting: Physician Assistant

## 2021-10-17 ENCOUNTER — Encounter (INDEPENDENT_AMBULATORY_CARE_PROVIDER_SITE_OTHER): Payer: Self-pay | Admitting: Physician Assistant

## 2021-10-17 VITALS — BP 138/78 | HR 64 | Temp 98.2°F | Ht 64.0 in | Wt 172.0 lb

## 2021-10-17 DIAGNOSIS — M159 Polyosteoarthritis, unspecified: Secondary | ICD-10-CM | POA: Insufficient documentation

## 2021-10-17 DIAGNOSIS — F419 Anxiety disorder, unspecified: Secondary | ICD-10-CM | POA: Insufficient documentation

## 2021-10-17 DIAGNOSIS — F32A Depression, unspecified: Secondary | ICD-10-CM | POA: Insufficient documentation

## 2021-10-17 DIAGNOSIS — G8929 Other chronic pain: Secondary | ICD-10-CM | POA: Insufficient documentation

## 2021-10-17 DIAGNOSIS — M545 Low back pain, unspecified: Secondary | ICD-10-CM | POA: Insufficient documentation

## 2021-10-17 DIAGNOSIS — I10 Essential (primary) hypertension: Secondary | ICD-10-CM | POA: Insufficient documentation

## 2021-10-17 MED ORDER — SIMVASTATIN 40 MG TABLET
40.0000 mg | ORAL_TABLET | Freq: Every evening | ORAL | 1 refills | Status: DC
Start: 2021-10-17 — End: 2022-02-08

## 2021-10-17 MED ORDER — HYDROCODONE 7.5 MG-ACETAMINOPHEN 325 MG TABLET
1.0000 | ORAL_TABLET | Freq: Three times a day (TID) | ORAL | 0 refills | Status: DC | PRN
Start: 2021-10-17 — End: 2021-11-14
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
## 2021-10-17 NOTE — Nursing Note (Signed)
Patient in office this morning for check up and pain med refill.  No new complains voiced at this time.

## 2021-11-14 ENCOUNTER — Ambulatory Visit: Payer: Medicare HMO | Attending: Physician Assistant | Admitting: Physician Assistant

## 2021-11-14 ENCOUNTER — Other Ambulatory Visit (RURAL_HEALTH_CENTER): Payer: Self-pay | Admitting: Family Medicine

## 2021-11-14 ENCOUNTER — Other Ambulatory Visit: Payer: Self-pay

## 2021-11-14 ENCOUNTER — Encounter (INDEPENDENT_AMBULATORY_CARE_PROVIDER_SITE_OTHER): Payer: Self-pay | Admitting: Physician Assistant

## 2021-11-14 VITALS — BP 124/78 | HR 69 | Temp 97.8°F | Ht 65.0 in | Wt 176.0 lb

## 2021-11-14 DIAGNOSIS — M159 Polyosteoarthritis, unspecified: Secondary | ICD-10-CM | POA: Insufficient documentation

## 2021-11-14 DIAGNOSIS — I10 Essential (primary) hypertension: Secondary | ICD-10-CM | POA: Insufficient documentation

## 2021-11-14 DIAGNOSIS — F32A Depression, unspecified: Secondary | ICD-10-CM | POA: Insufficient documentation

## 2021-11-14 DIAGNOSIS — M545 Low back pain, unspecified: Secondary | ICD-10-CM | POA: Insufficient documentation

## 2021-11-14 DIAGNOSIS — G8929 Other chronic pain: Secondary | ICD-10-CM | POA: Insufficient documentation

## 2021-11-14 DIAGNOSIS — F419 Anxiety disorder, unspecified: Secondary | ICD-10-CM | POA: Insufficient documentation

## 2021-11-14 MED ORDER — HYDROCODONE 7.5 MG-ACETAMINOPHEN 325 MG TABLET
1.0000 | ORAL_TABLET | Freq: Three times a day (TID) | ORAL | 0 refills | Status: DC | PRN
Start: 2021-11-14 — End: 2021-12-12

## 2021-11-14 MED ORDER — CLONAZEPAM 1 MG TABLET
1.0000 mg | ORAL_TABLET | Freq: Every evening | ORAL | 2 refills | Status: DC
Start: 2021-11-14 — End: 2021-12-12

## 2021-11-14 NOTE — Nursing Note (Signed)
Patient in office today for med refill of pain med.  Patient voiced no new problems today.
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
## 2021-11-14 NOTE — Telephone Encounter (Signed)
Patient in office today for refill

## 2021-12-12 ENCOUNTER — Encounter (INDEPENDENT_AMBULATORY_CARE_PROVIDER_SITE_OTHER): Payer: Self-pay | Admitting: Physician Assistant

## 2021-12-12 ENCOUNTER — Other Ambulatory Visit: Payer: Self-pay

## 2021-12-12 ENCOUNTER — Other Ambulatory Visit (RURAL_HEALTH_CENTER): Payer: Self-pay | Admitting: Family Medicine

## 2021-12-12 ENCOUNTER — Ambulatory Visit: Payer: Medicare HMO | Attending: Physician Assistant | Admitting: Physician Assistant

## 2021-12-12 VITALS — BP 120/80 | HR 76 | Temp 99.5°F | Ht 65.0 in | Wt 171.0 lb

## 2021-12-12 DIAGNOSIS — I10 Essential (primary) hypertension: Secondary | ICD-10-CM | POA: Insufficient documentation

## 2021-12-12 DIAGNOSIS — F32A Depression, unspecified: Secondary | ICD-10-CM | POA: Insufficient documentation

## 2021-12-12 DIAGNOSIS — M159 Polyosteoarthritis, unspecified: Secondary | ICD-10-CM | POA: Insufficient documentation

## 2021-12-12 DIAGNOSIS — M545 Low back pain, unspecified: Secondary | ICD-10-CM | POA: Insufficient documentation

## 2021-12-12 DIAGNOSIS — Z23 Encounter for immunization: Secondary | ICD-10-CM | POA: Insufficient documentation

## 2021-12-12 DIAGNOSIS — G8929 Other chronic pain: Secondary | ICD-10-CM | POA: Insufficient documentation

## 2021-12-12 DIAGNOSIS — F419 Anxiety disorder, unspecified: Secondary | ICD-10-CM | POA: Insufficient documentation

## 2021-12-12 MED ORDER — CLONAZEPAM 1 MG TABLET
1.0000 mg | ORAL_TABLET | Freq: Every evening | ORAL | 2 refills | Status: DC
Start: 2021-12-12 — End: 2022-01-09

## 2021-12-12 MED ORDER — LISINOPRIL 10 MG TABLET
10.0000 mg | ORAL_TABLET | Freq: Every day | ORAL | 1 refills | Status: DC
Start: 2021-12-12 — End: 2022-02-08

## 2021-12-12 NOTE — Nursing Note (Signed)
Patient in office today for 1 month CDM
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
## 2021-12-13 MED ORDER — HYDROCODONE 7.5 MG-ACETAMINOPHEN 325 MG TABLET
1.0000 | ORAL_TABLET | Freq: Three times a day (TID) | ORAL | 0 refills | Status: DC | PRN
Start: 2021-12-13 — End: 2022-01-09

## 2022-01-09 ENCOUNTER — Encounter (INDEPENDENT_AMBULATORY_CARE_PROVIDER_SITE_OTHER): Payer: Self-pay | Admitting: Physician Assistant

## 2022-01-09 ENCOUNTER — Other Ambulatory Visit: Payer: Self-pay

## 2022-01-09 ENCOUNTER — Ambulatory Visit: Payer: Medicare HMO | Attending: Physician Assistant | Admitting: Physician Assistant

## 2022-01-09 ENCOUNTER — Other Ambulatory Visit (INDEPENDENT_AMBULATORY_CARE_PROVIDER_SITE_OTHER): Payer: Self-pay | Admitting: Physician Assistant

## 2022-01-09 VITALS — BP 126/72 | HR 57 | Temp 97.7°F | Wt 178.0 lb

## 2022-01-09 DIAGNOSIS — E559 Vitamin D deficiency, unspecified: Secondary | ICD-10-CM | POA: Insufficient documentation

## 2022-01-09 DIAGNOSIS — E782 Mixed hyperlipidemia: Secondary | ICD-10-CM | POA: Insufficient documentation

## 2022-01-09 DIAGNOSIS — M170 Bilateral primary osteoarthritis of knee: Secondary | ICD-10-CM | POA: Insufficient documentation

## 2022-01-09 DIAGNOSIS — R799 Abnormal finding of blood chemistry, unspecified: Secondary | ICD-10-CM | POA: Insufficient documentation

## 2022-01-09 DIAGNOSIS — F419 Anxiety disorder, unspecified: Secondary | ICD-10-CM | POA: Insufficient documentation

## 2022-01-09 DIAGNOSIS — N400 Enlarged prostate without lower urinary tract symptoms: Secondary | ICD-10-CM | POA: Insufficient documentation

## 2022-01-09 DIAGNOSIS — R5383 Other fatigue: Secondary | ICD-10-CM | POA: Insufficient documentation

## 2022-01-09 DIAGNOSIS — M545 Low back pain, unspecified: Secondary | ICD-10-CM | POA: Insufficient documentation

## 2022-01-09 DIAGNOSIS — I10 Essential (primary) hypertension: Secondary | ICD-10-CM | POA: Insufficient documentation

## 2022-01-09 DIAGNOSIS — G8929 Other chronic pain: Secondary | ICD-10-CM | POA: Insufficient documentation

## 2022-01-09 DIAGNOSIS — Z79899 Other long term (current) drug therapy: Secondary | ICD-10-CM | POA: Insufficient documentation

## 2022-01-09 DIAGNOSIS — R7309 Other abnormal glucose: Secondary | ICD-10-CM | POA: Insufficient documentation

## 2022-01-09 DIAGNOSIS — F32A Depression, unspecified: Secondary | ICD-10-CM | POA: Insufficient documentation

## 2022-01-09 DIAGNOSIS — H00015 Hordeolum externum left lower eyelid: Secondary | ICD-10-CM | POA: Insufficient documentation

## 2022-01-09 LAB — URINE DRUG SCREEN
AMPHET QL: NEGATIVE
BARB QL: NEGATIVE
BENZO QL: NEGATIVE
BUP QL: NEGATIVE
CANNAQL: POSITIVE — AB
COCQL: NEGATIVE
FENTANYL, RANDOM URINE: NEGATIVE
METHQL: NEGATIVE
OPIATE: POSITIVE — AB
OXYCODONE URINE: NEGATIVE
PCP QL: NEGATIVE

## 2022-01-09 LAB — COMPREHENSIVE METABOLIC PNL, FASTING
ALBUMIN/GLOBULIN RATIO: 1.6 — ABNORMAL HIGH (ref 0.8–1.4)
ALBUMIN: 4.5 g/dL (ref 3.5–5.7)
ALKALINE PHOSPHATASE: 39 U/L (ref 34–104)
ALT (SGPT): 12 U/L (ref 7–52)
ANION GAP: 7 mmol/L (ref 4–13)
AST (SGOT): 23 U/L (ref 13–39)
BILIRUBIN TOTAL: 0.7 mg/dL (ref 0.3–1.2)
BUN/CREA RATIO: 12 (ref 6–22)
BUN: 10 mg/dL (ref 7–25)
CALCIUM, CORRECTED: 8.9 mg/dL (ref 8.9–10.8)
CALCIUM: 9.4 mg/dL (ref 8.6–10.3)
CHLORIDE: 106 mmol/L (ref 98–107)
CO2 TOTAL: 25 mmol/L (ref 21–31)
CREATININE: 0.83 mg/dL (ref 0.60–1.30)
ESTIMATED GFR: 96 mL/min/{1.73_m2} (ref >59)
GLOBULIN: 2.9 (ref 2.9–5.4)
GLUCOSE: 105 mg/dL (ref 74–109)
OSMOLALITY, CALCULATED: 275 mOsm/kg (ref 270–290)
POTASSIUM: 3.9 mmol/L (ref 3.5–5.1)
PROTEIN TOTAL: 7.4 g/dL (ref 6.4–8.9)
SODIUM: 138 mmol/L (ref 136–145)

## 2022-01-09 LAB — MICROALBUMIN/CREATININE RATIO, URINE, RANDOM
CREATININE RANDOM URINE: 74 mg/dL — ABNORMAL HIGH (ref 11–26)
MICROALBUMIN RANDOM URINE: <0.2 mg/dL

## 2022-01-09 LAB — CBC WITH DIFF
BASOPHIL #: 0.1 10*3/uL (ref 0.00–0.10)
BASOPHIL %: 1 % (ref 0–1)
EOSINOPHIL #: 0.3 10*3/uL (ref 0.00–0.50)
EOSINOPHIL %: 4 %
HCT: 41.8 % (ref 36.7–47.1)
HGB: 13.9 g/dL (ref 12.5–16.3)
LYMPHOCYTE #: 2.2 10*3/uL (ref 1.00–3.00)
LYMPHOCYTE %: 28 % (ref 16–44)
MCH: 28.4 pg (ref 23.8–33.4)
MCHC: 33.2 g/dL (ref 32.5–36.3)
MCV: 85.3 fL (ref 73.0–96.2)
MONOCYTE #: 0.8 10*3/uL (ref 0.30–1.00)
MONOCYTE %: 10 % (ref 5–13)
MPV: 9.7 fL (ref 7.4–11.4)
NEUTROPHIL #: 4.5 10*3/uL (ref 1.85–7.80)
NEUTROPHIL %: 58 % (ref 43–77)
PLATELETS: 223 10*3/uL (ref 140–440)
RBC: 4.9 10*6/uL (ref 4.06–5.63)
RDW: 13.6 % (ref 12.1–16.2)
WBC: 7.9 10*3/uL (ref 3.6–10.2)

## 2022-01-09 LAB — VITAMIN D 25 TOTAL: VITAMIN D: 66 ng/mL (ref 30–100)

## 2022-01-09 LAB — URINALYSIS, MACROSCOPIC
BILIRUBIN: NEGATIVE mg/dL
BLOOD: NEGATIVE mg/dL
GLUCOSE: NEGATIVE mg/dL
KETONES: NEGATIVE mg/dL
LEUKOCYTES: NEGATIVE WBCs/uL
NITRITE: NEGATIVE
PH: 6.5 (ref 5.0–9.0)
PROTEIN: NEGATIVE mg/dL
SPECIFIC GRAVITY: 1.011 (ref 1.002–1.030)
UROBILINOGEN: NORMAL mg/dL

## 2022-01-09 LAB — LIPID PANEL
CHOL/HDL RATIO: 2.8
CHOLESTEROL: 146 mg/dL (ref <200)
HDL CHOL: 52 mg/dL (ref 23–92)
LDL CALC: 81 mg/dL (ref 0–100)
TRIGLYCERIDES: 67 mg/dL (ref <=150)
VLDL CALC: 13 mg/dL (ref 0–50)

## 2022-01-09 LAB — MAGNESIUM: MAGNESIUM: 1.9 mg/dL (ref 1.9–2.7)

## 2022-01-09 LAB — IRON TRANSFERRIN AND TIBC
IRON (TRANSFERRIN) SATURATION: 26 % (ref 20–50)
IRON: 92 ug/dL (ref 50–212)
TOTAL IRON BINDING CAPACITY: 350 ug/dL (ref 250–450)
TRANSFERRIN: 250 mg/dL (ref 203–362)
UIBC: 258 ug/dL (ref 130–375)

## 2022-01-09 LAB — VITAMIN B12: VITAMIN B 12: 201 pg/mL (ref 180–914)

## 2022-01-09 LAB — THYROID STIMULATING HORMONE WITH FREE T4 REFLEX: TSH: 0.665 u[IU]/mL (ref 0.450–5.330)

## 2022-01-09 LAB — FOLATE: FOLATE: 16.1 ng/mL (ref 5.9–24.4)

## 2022-01-09 LAB — HGA1C (HEMOGLOBIN A1C WITH EST AVG GLUCOSE): HEMOGLOBIN A1C: 5.7 % (ref 4.0–6.0)

## 2022-01-09 LAB — PSA, DIAGNOSTIC: PSA: 0.27 ng/mL (ref <=4.00)

## 2022-01-09 LAB — URINALYSIS, MICROSCOPIC
RBCS: <1 /hpf (ref <4)
WBCS: <1 /hpf (ref <6)

## 2022-01-09 MED ORDER — ERYTHROMYCIN 5 MG/GRAM (0.5 %) EYE OINTMENT
TOPICAL_OINTMENT | Freq: Four times a day (QID) | OPHTHALMIC | 1 refills | Status: AC
Start: 2022-01-09 — End: 2022-01-16

## 2022-01-09 MED ORDER — CLONAZEPAM 1 MG TABLET
1.0000 mg | ORAL_TABLET | Freq: Every evening | ORAL | 2 refills | Status: DC
Start: 2022-01-09 — End: 2022-02-08

## 2022-01-09 NOTE — Progress Notes (Signed)
[facility]ICINE, [facility]  [address]  [place]  [address]    History and Physical     Name: Gregory Case MRN:  [id number]   Date: 01/09/2022 Age: 66 y.o.               Follow Up        Reason for Visit: Follow Up (Routine one month follow up. )  stye on eye    History of Present Illness  Gregory Case is a 66 y.o. male who is being seen today in the office for follow-up however acute complaints noted w reported stye on left lower eyelid x 3 weeks. Pt bought some OTC med which has helped it some but still red irritated and swollen. No vision complaints noted. Area is smaller overall.     F/U blood pressure/hypertension with reading today 126/72.Patient also monitors outpatient states readings are good. Overall he feels well denies any headaches dizziness chest pain. Continues to take prinivil 10 mg po daily.    F/u chol/lipids w pt currently on Zocor 40 mg po daily. He continues to watch diet as well and denies any SE or issues w med. F/u labs needed.    F/u abnormal glucose w last HGBAIC 5.7. pt does not take any med at this time and we continue to monitor blood work. No increased thirst urination noted.    F/U chronic anxiety/depresion w pt on prozac 10 mg daily as well as clonazepam 1mg  prn. Pt states he is doing well at this time and denies any panic attacks or increased anxiety/depression sx.     Follow-up chronic back pain/DJD/OA for which patient has been on long-term narcotic use for multiple years tolerated ;hx of TKR noted in past ; patient states he still has arthritic pains in the hands knees and sometimes between dosing of Norco he uses Duexis or motrin. States at this time back pain is stable  and no new issues to report and medication refills requested. No  abuse history noted and patient compliant with urine drug screen.    Colonoscopy up-to-date; will be due again in 2026    Flu shot up to date    Jarales interest or pleasure in doing things.: Not at  all  Feeling down, depressed, or hopeless: Not at all  PHQ 2 Total: 0  Trouble falling or staying asleep, or sleeping too much.: Not at all  Feeling tired or having little energy: Not at all  Poor appetite or overeating: Not at all  Feeling bad about yourself/ that you are a failure in the past 2 weeks?: Not at all  Trouble concentrating on things in the past 2 weeks?: Not at all  Moving/Speaking slowly or being fidgety or restless  in the past 2 weeks?: Not at all  Thoughts that you would be better off DEAD, or of hurting yourself in some way.: Not at all  PHQ 9 Total: 0  Interpretation of Total Score: 0-4 No depression      Past Medical History:   Diagnosis Date    Abnormal glucose level     Anxiety     Chronic back pain     COVID-19 vaccine series completed     Depression     Mixed hyperlipidemia     Osteoarthritis          Past Surgical History:   Procedure Laterality Date    COLONOSCOPY W/ POLYPECTOMY      HX BACK SURGERY  HX CARPAL TUNNEL RELEASE      HX KNEE REPLACMENT Right     HX TONSILLECTOMY        Family Medical History:       Problem Relation (Age of Onset)    Coronary Artery Disease Mother    Diabetes type II Mother    Hypertension (High Blood Pressure) Other            Social History     Tobacco Use    Smoking status: Never    Smokeless tobacco: Never   Substance Use Topics    Alcohol use: Yes     Comment: few times a week    Drug use: Never     Medication:  FLUoxetine (PROZAC) 10 mg Oral Capsule, Take 1 Capsule (10 mg total) by mouth Once a day  HYDROcodone-acetaminophen (NORCO) 7.5-325 mg Oral Tablet, Take 1 Tablet by mouth Three times a day as needed for Pain for up to 30 days  lisinopriL (PRINIVIL) 10 mg Oral Tablet, Take 1 Tablet (10 mg total) by mouth Once a day  omega 3-dha-epa-fish oil 300 mg (120 mg- 180mg )-1,000 mg Oral Capsule, Take 1 Capsule by mouth Once a day  simvastatin (ZOCOR) 40 mg Oral Tablet, Take 1 Tablet (40 mg total) by mouth Every night  clonazePAM (KLONOPIN) 1 mg Oral  Tablet, Take 1 Tablet (1 mg total) by mouth Every night    No facility-administered medications prior to visit.    Allergies:  Allergies   Allergen Reactions    Meloxicam Hives/ Urticaria       Physical Exam:  Vitals:    01/09/22 0831   BP: 126/72   Pulse: 57   Temp: 36.5 C (97.7 F)   SpO2: 96%   Weight: 80.7 kg (178 lb)        Physical Exam  Vitals and nursing note reviewed.   Constitutional:       General: He is not in acute distress.     Appearance: Normal appearance.   HENT:      Right Ear: Tympanic membrane normal.      Left Ear: Tympanic membrane normal.      Mouth/Throat:      Mouth: Mucous membranes are moist.      Pharynx: Oropharynx is clear.   Eyes:      Pupils: Pupils are equal, round, and reactive to light.      Comments: +external stye noted mid left lower eyelid  No orbital edema or swelling noted  No drainage w palp of area   Cardiovascular:      Rate and Rhythm: Normal rate and regular rhythm.      Pulses: Normal pulses.      Heart sounds: Normal heart sounds.   Pulmonary:      Effort: Pulmonary effort is normal.      Breath sounds: Normal breath sounds.   Abdominal:      General: Bowel sounds are normal.      Palpations: Abdomen is soft.      Tenderness: There is no abdominal tenderness.   Musculoskeletal:         General: No swelling or tenderness. Normal range of motion.      Cervical back: Normal range of motion and neck supple.      Comments: Chronic lower back pain with forward flexion extension which is limited  Negative straight leg raise bilateral  Arthritic changes bilateral hands and knees   Skin:     General: Skin is  warm.      Findings: No lesion or rash.   Neurological:      General: No focal deficit present.      Mental Status: He is alert and oriented to person, place, and time.   Psychiatric:         Mood and Affect: Mood normal.         Behavior: Behavior normal.         Thought Content: Thought content normal.         Judgment: Judgment normal.         Assessment/Plan:  Problem  List Items Addressed This Visit          Cardiovascular System    Mixed hyperlipidemia    Relevant Orders    COMPREHENSIVE METABOLIC PNL, FASTING    LIPID PANEL    Essential hypertension    Relevant Orders    CBC/DIFF    COMPREHENSIVE METABOLIC PNL, FASTING       Endocrine    Vitamin D deficiency    Relevant Orders    VITAMIN D 25 TOTAL       Musculoskeletal    Chronic back pain       Urology    Benign prostatic hyperplasia    Relevant Orders    PSA, DIAGNOSTIC       Psychiatric    Anxiety    Depression       Other    Abnormal glucose level    Relevant Orders    CBC/DIFF    COMPREHENSIVE METABOLIC PNL, FASTING    123456 (HEMOGLOBIN A1C WITH EST AVG GLUCOSE)    URINALYSIS, MACROSCOPIC AND MICROSCOPIC W/CULTURE REFLEX    MICROALBUMIN/CREATININE RATIO, URINE, RANDOM     Other Visit Diagnoses       Hordeolum externum of left lower eyelid    -  Primary    Fatigue, unspecified type        Relevant Orders    CBC/DIFF    IRON TRANSFERRIN AND TIBC    MAGNESIUM    THYROID STIMULATING HORMONE WITH FREE T4 REFLEX    VITAMIN B12    FOLATE    Long-term use of high-risk medication        Relevant Orders    URINE DRUG SCREEN    Abnormal finding of blood chemistry, unspecified        Relevant Orders    IRON TRANSFERRIN AND TIBC          Labs obtained in office  Warm compresses to eye as discussed BID  Emycin ointment as directed x 5- 7 days  Meds refilled as noted  Screening test up to date  F/u pending lab results otherwise as noted      Post-Discharge Follow Up Appointments         Wednesday Feb 08, 2022    Return Patient Visit with Cleophus Molt, PA-C at  8:00 AM      Thursday Mar 09, 2022    Return Patient Visit with Cleophus Molt, PA-C at  8:30 AM      Tuesday May 30, 2022    Return Telephone Visit with Cleophus Molt, PA-C at  1:00 PM      Internal Medicine, Building [facility]  [facility]  [address]  [place] [address]  [phone]            Seek medical attention for new or worsening symptoms.  Patient has been seen in  this clinic within the last  3 years.     Perry Brucato, PA-C          This note was partially created using MModal Fluency Direct system (voice recognition software) and is inherently subject to errors including those of syntax and "sound-alike" substitutions which may escape proofreading.  In such instances, original meaning may be extrapolated by contextual derivation.

## 2022-01-09 NOTE — Nursing Note (Signed)
Patient is here for routine 1 month follow up for medication refill. Patient has already received flu vaccination.

## 2022-01-09 NOTE — Telephone Encounter (Signed)
Patient in office for routine 1 month follow up for medication refill.

## 2022-01-11 ENCOUNTER — Other Ambulatory Visit (RURAL_HEALTH_CENTER): Payer: Self-pay | Admitting: Family Medicine

## 2022-01-11 MED ORDER — HYDROCODONE 7.5 MG-ACETAMINOPHEN 325 MG TABLET
1.0000 | ORAL_TABLET | Freq: Three times a day (TID) | ORAL | 0 refills | Status: DC | PRN
Start: 2022-01-11 — End: 2022-02-08

## 2022-01-13 ENCOUNTER — Encounter (INDEPENDENT_AMBULATORY_CARE_PROVIDER_SITE_OTHER): Payer: Self-pay | Admitting: Physician Assistant

## 2022-02-06 ENCOUNTER — Telehealth (INDEPENDENT_AMBULATORY_CARE_PROVIDER_SITE_OTHER): Payer: Self-pay | Admitting: Physician Assistant

## 2022-02-06 MED ORDER — AZITHROMYCIN 250 MG TABLET
ORAL_TABLET | ORAL | 0 refills | Status: DC
Start: 2022-02-06 — End: 2022-03-08

## 2022-02-08 ENCOUNTER — Encounter (INDEPENDENT_AMBULATORY_CARE_PROVIDER_SITE_OTHER): Payer: Self-pay | Admitting: Physician Assistant

## 2022-02-08 ENCOUNTER — Other Ambulatory Visit: Payer: Self-pay

## 2022-02-08 ENCOUNTER — Other Ambulatory Visit (INDEPENDENT_AMBULATORY_CARE_PROVIDER_SITE_OTHER): Payer: Self-pay | Admitting: Physician Assistant

## 2022-02-08 ENCOUNTER — Ambulatory Visit: Payer: Medicare HMO | Attending: Physician Assistant | Admitting: Physician Assistant

## 2022-02-08 VITALS — BP 139/76 | HR 74 | Wt 179.0 lb

## 2022-02-08 DIAGNOSIS — J020 Streptococcal pharyngitis: Secondary | ICD-10-CM | POA: Insufficient documentation

## 2022-02-08 DIAGNOSIS — Z1152 Encounter for screening for COVID-19: Secondary | ICD-10-CM | POA: Insufficient documentation

## 2022-02-08 DIAGNOSIS — I10 Essential (primary) hypertension: Secondary | ICD-10-CM | POA: Insufficient documentation

## 2022-02-08 DIAGNOSIS — J069 Acute upper respiratory infection, unspecified: Secondary | ICD-10-CM | POA: Insufficient documentation

## 2022-02-08 DIAGNOSIS — E782 Mixed hyperlipidemia: Secondary | ICD-10-CM | POA: Insufficient documentation

## 2022-02-08 DIAGNOSIS — G8929 Other chronic pain: Secondary | ICD-10-CM | POA: Insufficient documentation

## 2022-02-08 DIAGNOSIS — M545 Low back pain, unspecified: Secondary | ICD-10-CM | POA: Insufficient documentation

## 2022-02-08 DIAGNOSIS — F419 Anxiety disorder, unspecified: Secondary | ICD-10-CM | POA: Insufficient documentation

## 2022-02-08 DIAGNOSIS — F32A Depression, unspecified: Secondary | ICD-10-CM | POA: Insufficient documentation

## 2022-02-08 LAB — POCT RAPID COVID (SOFIA) (AMB ONLY): COVID-19 AG: NEGATIVE

## 2022-02-08 LAB — COVID-19, FLU A/B, RSV RAPID BY PCR
INFLUENZA VIRUS TYPE A: NOT DETECTED
INFLUENZA VIRUS TYPE B: NOT DETECTED
RESPIRATORY SYNCTIAL VIRUS (RSV): NOT DETECTED
SARS-CoV-2: NOT DETECTED

## 2022-02-08 MED ORDER — FLUOXETINE 10 MG CAPSULE
10.0000 mg | ORAL_CAPSULE | Freq: Every day | ORAL | 1 refills | Status: DC
Start: 2022-02-08 — End: 2022-04-06

## 2022-02-08 MED ORDER — SIMVASTATIN 40 MG TABLET
40.0000 mg | ORAL_TABLET | Freq: Every evening | ORAL | 1 refills | Status: DC
Start: 2022-02-08 — End: 2022-04-06

## 2022-02-08 MED ORDER — TRIAMCINOLONE ACETONIDE 40 MG/ML SUSPENSION FOR INJECTION
40.0000 mg | INTRAMUSCULAR | Status: AC
Start: 2022-02-08 — End: 2022-02-08
  Administered 2022-02-08: 40 mg via INTRAMUSCULAR

## 2022-02-08 MED ORDER — BENZONATATE 100 MG CAPSULE
100.0000 mg | ORAL_CAPSULE | Freq: Three times a day (TID) | ORAL | 0 refills | Status: DC | PRN
Start: 2022-02-08 — End: 2022-03-08

## 2022-02-08 MED ORDER — CLONAZEPAM 1 MG TABLET
1.0000 mg | ORAL_TABLET | Freq: Every evening | ORAL | 2 refills | Status: DC
Start: 2022-02-08 — End: 2022-03-08

## 2022-02-08 MED ORDER — LISINOPRIL 10 MG TABLET
10.0000 mg | ORAL_TABLET | Freq: Every day | ORAL | 1 refills | Status: DC
Start: 2022-02-08 — End: 2022-04-06

## 2022-02-08 NOTE — Telephone Encounter (Signed)
Patient was here for routine 1 month follow up for medication refills.

## 2022-02-08 NOTE — Progress Notes (Signed)
[facility], [facility]  [address]  [place] [address]    History and Physical     Name: Gregory Case MRN:  [id number]   Date: 02/08/2022 Age: 66 y.o.               Follow Up        Reason for Visit: Follow Up (One month follow up visit. ), Sore Throat, Cough, and Fatigue      History of Present Illness  Gregory Case is a 66 y.o. male who is being seen today in the office for follow-up however acute complaints of sore throat sinus drainage cough and chest cong x 1 week. States sx started after having the shingles shot approximately 2 weeks. Sputum now thick yellow. No sob fever chills noted.  Pt did call Monday requesting Z-Pak for sx and states he took a dose yest and this am however no change in sx yet noted.    F/U blood pressure/hypertension with reading today 139/76. Patient also monitors outpatient states readings are good. Overall he feels well denies any headaches dizziness chest pain. Continues to take prinivil 10 mg po daily.    F/u chol/lipids w pt currently on Zocor 40 mg po daily. He continues to watch diet as well and denies any SE or issues w med.  Needs med refills however Labs up to date.    Follow-up chronic back pain/DJD/OA for which patient has been on long-term narcotic use for multiple years tolerated ;hx of TKR noted in past ; patient states he still has arthritic pains in the hands knees and sometimes between dosing of Norco he uses Duexis or motrin. States at this time back pain is stable  and no new issues to report and medication refills requested. No  abuse history noted and patient compliant with urine drug screen.    F/U chronic anxiety/depression w pt on prozac 10 mg daily as well as clonazepam 1mg  prn. Pt states he is doing well at this time and denies any panic attacks or increased anxiety/depression sx.     Colonoscopy up-to-date; will be due again in 2026    Flu shot up to date    PHQ Questionnaire         Past Medical History:   Diagnosis Date     Abnormal glucose level     Anxiety     Chronic back pain     COVID-19 vaccine series completed     Depression     Mixed hyperlipidemia     Osteoarthritis          Past Surgical History:   Procedure Laterality Date    COLONOSCOPY W/ POLYPECTOMY      HX BACK SURGERY      HX CARPAL TUNNEL RELEASE      HX KNEE REPLACMENT Right     HX TONSILLECTOMY        Family Medical History:       Problem Relation (Age of Onset)    Coronary Artery Disease Mother    Diabetes type II Mother    Hypertension (High Blood Pressure) Other            Social History     Tobacco Use    Smoking status: Never    Smokeless tobacco: Never   Substance Use Topics    Alcohol use: Yes     Comment: few times a week    Drug use: Never     Medication:  azithromycin (ZITHROMAX) 250  mg Oral Tablet, Take 500 mg (2 tab) on day 1; take 250 mg (1 tab) on days 2-5.  omega 3-dha-epa-fish oil 300 mg (120 mg- 180mg )-1,000 mg Oral Capsule, Take 1 Capsule by mouth Once a day  clonazePAM (KLONOPIN) 1 mg Oral Tablet, Take 1 Tablet (1 mg total) by mouth Every night  FLUoxetine (PROZAC) 10 mg Oral Capsule, Take 1 Capsule (10 mg total) by mouth Once a day  HYDROcodone-acetaminophen (NORCO) 7.5-325 mg Oral Tablet, Take 1 Tablet by mouth Three times a day as needed for Pain for up to 30 days  lisinopriL (PRINIVIL) 10 mg Oral Tablet, Take 1 Tablet (10 mg total) by mouth Once a day  simvastatin (ZOCOR) 40 mg Oral Tablet, Take 1 Tablet (40 mg total) by mouth Every night    No facility-administered medications prior to visit.    Allergies:  Allergies   Allergen Reactions    Meloxicam Hives/ Urticaria       Physical Exam:  Vitals:    02/08/22 0819   BP: 139/76   Pulse: 74   SpO2: 96%   Weight: 81.2 kg (179 lb)        Physical Exam  Vitals and nursing note reviewed.   Constitutional:       General: He is not in acute distress.     Appearance: Normal appearance.   HENT:      Right Ear: Tympanic membrane normal.      Left Ear: Tympanic membrane normal.      Nose: Rhinorrhea  present.      Mouth/Throat:      Mouth: Mucous membranes are moist.      Pharynx: Posterior oropharyngeal erythema present.   Eyes:      Pupils: Pupils are equal, round, and reactive to light.   Cardiovascular:      Rate and Rhythm: Normal rate and regular rhythm.      Pulses: Normal pulses.      Heart sounds: Normal heart sounds.   Pulmonary:      Effort: Pulmonary effort is normal.      Breath sounds: Rhonchi present. No wheezing.   Abdominal:      Palpations: Abdomen is soft.      Tenderness: There is no abdominal tenderness.   Musculoskeletal:         General: No swelling or tenderness. Normal range of motion.      Cervical back: Normal range of motion and neck supple.      Comments: Chronic lower back pain with forward flexion extension which is limited  Negative straight leg raise bilateral  Arthritic changes bilateral hands and knees   Lymphadenopathy:      Cervical: Cervical adenopathy (Submandibular) present.   Skin:     General: Skin is warm.      Findings: No lesion or rash.   Neurological:      General: No focal deficit present.      Mental Status: He is alert and oriented to person, place, and time.   Psychiatric:         Mood and Affect: Mood normal.         Behavior: Behavior normal.         Thought Content: Thought content normal.         Judgment: Judgment normal.           Rapid Strep Results:    Rapid Strep: Positive    Rapid Flu    Rapid Flu A Result: Negative  Rapid  Flu B Result: Negative     Rapid COVID:    Negative    Assessment/Plan:  Problem List Items Addressed This Visit          Cardiovascular System    Mixed hyperlipidemia    Essential hypertension       Musculoskeletal    Chronic back pain       Psychiatric    Anxiety    Depression     Other Visit Diagnoses       Strep pharyngitis    -  Primary    Relevant Orders    POCT RAPID STREP A    COVID-19, FLU A/B, RSV RAPID BY PCR (Completed)    Upper respiratory tract infection, unspecified type        Relevant Orders    POCT Rapid Covid  ([facility]) (AMB Only) (Completed)    POCT Influenza A & B    COVID-19, FLU A/B, RSV RAPID BY PCR (Completed)          Strep positive on rapid screen  PCR sent for COVID RSV testing  Kenalog injection given for upper respiratory infection/rhonchi  Continue Z-Pak as directed  Tessalon Perles 100 mg t.i.d. p.r.n.  Rest fluids Tylenol Motrin p.r.n.   Meds refilled as noted  Follow-up symptoms no better 4-5 days otherwise is scheduled    Post-Discharge Follow Up Appointments                   Thursday Mar 09, 2022    Return Patient Visit with Eyvonne Mechanic, PA-C at  8:30 AM      Tuesday May 30, 2022    Return Telephone Visit with Eyvonne Mechanic, PA-C at  1:00 PM      Internal Medicine, Building [facility]  [facility]  [address]  [place] [address]  [phone]            Seek medical attention for new or worsening symptoms.  Patient has been seen in this clinic within the last 3 years.     Merin Borjon Hyman Bible, PA-C      I personally reviewed the documentation of care provided by the certified physician assistant and I agree with her medical decision making, Weyman Pedro, D.O.       This note was partially created using MModal Fluency Direct system (voice recognition software) and is inherently subject to errors including those of syntax and "sound-alike" substitutions which may escape proofreading.  In such instances, original meaning may be extrapolated by contextual derivation.

## 2022-02-08 NOTE — Nursing Note (Signed)
02/08/22 0900   Rapid Flu   Time Performed 0912   Rapid Flu A Result Negative   Rapid Flu B Result Negative   Initials KD,CMA   Rapid Strep   Time Performed 0911   Rapid Strep Positive   Nurse initials KD,CMA   Ordering Physician Amy Alvis,PA-C   Throat Culture Sent No

## 2022-02-08 NOTE — Nursing Note (Signed)
Patient here for one month follow up visit.

## 2022-02-09 MED ORDER — HYDROCODONE 7.5 MG-ACETAMINOPHEN 325 MG TABLET
1.0000 | ORAL_TABLET | Freq: Three times a day (TID) | ORAL | 0 refills | Status: DC | PRN
Start: 2022-02-09 — End: 2022-03-08

## 2022-03-08 ENCOUNTER — Other Ambulatory Visit: Payer: Self-pay

## 2022-03-08 ENCOUNTER — Other Ambulatory Visit (INDEPENDENT_AMBULATORY_CARE_PROVIDER_SITE_OTHER): Payer: Self-pay | Admitting: Physician Assistant

## 2022-03-08 ENCOUNTER — Encounter (INDEPENDENT_AMBULATORY_CARE_PROVIDER_SITE_OTHER): Payer: Self-pay | Admitting: Physician Assistant

## 2022-03-08 ENCOUNTER — Ambulatory Visit: Payer: Medicare HMO | Attending: Physician Assistant | Admitting: Physician Assistant

## 2022-03-08 DIAGNOSIS — M159 Polyosteoarthritis, unspecified: Secondary | ICD-10-CM | POA: Insufficient documentation

## 2022-03-08 DIAGNOSIS — F32A Depression, unspecified: Secondary | ICD-10-CM | POA: Insufficient documentation

## 2022-03-08 DIAGNOSIS — M545 Low back pain, unspecified: Secondary | ICD-10-CM | POA: Insufficient documentation

## 2022-03-08 DIAGNOSIS — F419 Anxiety disorder, unspecified: Secondary | ICD-10-CM | POA: Insufficient documentation

## 2022-03-08 DIAGNOSIS — I10 Essential (primary) hypertension: Secondary | ICD-10-CM | POA: Insufficient documentation

## 2022-03-08 DIAGNOSIS — G8929 Other chronic pain: Secondary | ICD-10-CM | POA: Insufficient documentation

## 2022-03-08 MED ORDER — CLONAZEPAM 1 MG TABLET
1.0000 mg | ORAL_TABLET | Freq: Every evening | ORAL | 2 refills | Status: DC
Start: 2022-03-08 — End: 2022-05-08

## 2022-03-08 MED ORDER — BENZONATATE 100 MG CAPSULE
100.0000 mg | ORAL_CAPSULE | Freq: Three times a day (TID) | ORAL | 2 refills | Status: DC | PRN
Start: 2022-03-08 — End: 2022-06-08

## 2022-03-08 NOTE — Telephone Encounter (Signed)
Patient completed telemed for routine 1 month follow up for medication refills.

## 2022-03-08 NOTE — Progress Notes (Signed)
[facility]ICINE, [name]  [address]  [place] [address]        Telephone Visit    Name:  Gregory Case MRN: [id number]   Date:  03/08/2022 Age:   67 y.o.     The patient/family initiated a request for telephone service.  Verbal consent for this service was obtained from the patient/family.    TELEMEDICINE DOCUMENTATION:    Patient Location:  [place]/on [address]    Patient/family aware of provider location:  yes  Patient/family consent for telemedicine:  yes  Examination observed and performed by:  Cleophus Molt, PA-C         Chief Complaint   Patient presents with    Medication Refill    Follow Up        Call notes:  Gregory Case is a 67 year old male being seen via TeleMed for F/U blood pressure/hypertension.  Patient states he has not checked his blood pressure at home lately because they have been on the road with the holidays but previous reading in office 126/72.  Patient states he feels well denies any headaches dizziness chest pain. Continues to take prinivil 10 mg po daily.    F/U chronic anxiety/depresion w pt on prozac 10 mg daily as well as clonazepam 1mg  prn. Pt states he is doing well at this time and denies any panic attacks or increased anxiety/depression sx.  Needs clonazepam refilled.    Follow-up chronic back pain/DJD/OA for which patient has been on long-term narcotic use for multiple years tolerated ;hx of TKR noted in past ; patient states he still has arthritic pains in the hands knees and sometimes between dosing of Norco he uses Duexis or motrin. States at this time back pain is stable  and no new issues to report and medication refills requested. No  abuse history noted and patient compliant with urine drug screen.    Colonoscopy up-to-date; will be due again in 2026    Current medications:     benzonatate (TESSALON) 100 mg Oral Capsule Take 1 Capsule (100 mg total) by mouth Three times a day as needed for Cough    clonazePAM (KLONOPIN) 1 mg Oral Tablet Take 1 Tablet (1  mg total) by mouth Every night    FLUoxetine (PROZAC) 10 mg Oral Capsule Take 1 Capsule (10 mg total) by mouth Once a day    HYDROcodone-acetaminophen (NORCO) 7.5-325 mg Oral Tablet Take 1 Tablet by mouth Three times a day as needed for Pain for up to 30 days    lisinopriL (PRINIVIL) 10 mg Oral Tablet Take 1 Tablet (10 mg total) by mouth Once a day    omega 3-dha-epa-fish oil 300 mg (120 mg- 180mg )-1,000 mg Oral Capsule Take 1 Capsule by mouth Once a day    simvastatin (ZOCOR) 40 mg Oral Tablet Take 1 Tablet (40 mg total) by mouth Every night        Allergies   Allergen Reactions    Meloxicam Hives/ Urticaria        Past Medical History:   Diagnosis Date    Abnormal glucose level     Anxiety     Chronic back pain     COVID-19 vaccine series completed     Depression     Mixed hyperlipidemia     Osteoarthritis         Social History     Socioeconomic History    Marital status: Married   Tobacco Use    Smoking status: Never  Smokeless tobacco: Never   Substance and Sexual Activity    Alcohol use: Yes     Comment: few times a week    Drug use: Never    Sexual activity: Yes     Partners: Female        Past Surgical History:   Procedure Laterality Date    COLONOSCOPY W/ POLYPECTOMY      HX BACK SURGERY      HX CARPAL TUNNEL RELEASE      HX KNEE REPLACMENT Right     HX TONSILLECTOMY          Family Medical History:       Problem Relation (Age of Onset)    Coronary Artery Disease Mother    Diabetes type II Mother    Hypertension (High Blood Pressure) Other               Physical Exam:    Physical Exam  Pulmonary:      Effort: Pulmonary effort is normal.   Neurological:      Mental Status: He is alert.   Psychiatric:         Mood and Affect: Mood normal.         Thought Content: Thought content normal.         Judgment: Judgment normal.            ICD-10-CM    1. Essential hypertension  I10       2. Anxiety  F41.9       3. Depression, unspecified depression type  F32.A       4. Chronic low back pain, unspecified back pain  laterality, unspecified whether sciatica present  M54.50     G89.29       5. Osteoarthritis of multiple joints, unspecified osteoarthritis type  M15.9           Plan    Meds refilled as noted  Continue current treatment plans  Follow-up 1 month    Total provider time spent with the patient on the phone: 14 minutes.    Post-Discharge Follow Up Appointments       Thursday Apr 06, 2022    Return Patient Visit with Cleophus Molt, PA-C at  8:30 AM      Thursday May 04, 2022    Return Patient Visit with Cleophus Molt, PA-C at  8:30 AM      Tuesday May 30, 2022    Return Telephone Visit with Cleophus Molt, PA-C at  1:00 PM      Monday Jul 03, 2022    Return Patient Visit with Cleophus Molt, PA-C at  8:00 AM      Internal Medicine, Building [facility]  [facility]  [address]  [place] [address]  [phone]             This note was partially created using MModal Fluency Direct system (voice recognition software) and is inherently subject to errors including those of syntax and "sound-alike" substitutions which may escape proofreading.  In such instances, original meaning may be extrapolated by contextual derivation.    Kathyleen Radice, PA-C

## 2022-03-09 ENCOUNTER — Encounter (INDEPENDENT_AMBULATORY_CARE_PROVIDER_SITE_OTHER): Payer: Self-pay | Admitting: Physician Assistant

## 2022-03-10 MED ORDER — HYDROCODONE 7.5 MG-ACETAMINOPHEN 325 MG TABLET
1.0000 | ORAL_TABLET | Freq: Three times a day (TID) | ORAL | 0 refills | Status: DC | PRN
Start: 2022-03-10 — End: 2022-04-06

## 2022-04-06 ENCOUNTER — Other Ambulatory Visit: Payer: Self-pay

## 2022-04-06 ENCOUNTER — Other Ambulatory Visit (INDEPENDENT_AMBULATORY_CARE_PROVIDER_SITE_OTHER): Payer: Self-pay | Admitting: Physician Assistant

## 2022-04-06 ENCOUNTER — Encounter (INDEPENDENT_AMBULATORY_CARE_PROVIDER_SITE_OTHER): Payer: Self-pay | Admitting: Physician Assistant

## 2022-04-06 ENCOUNTER — Ambulatory Visit: Payer: Medicare HMO | Attending: Physician Assistant | Admitting: Physician Assistant

## 2022-04-06 VITALS — BP 118/80 | HR 64 | Ht 65.0 in | Wt 176.0 lb

## 2022-04-06 DIAGNOSIS — M545 Low back pain, unspecified: Secondary | ICD-10-CM | POA: Insufficient documentation

## 2022-04-06 DIAGNOSIS — F39 Unspecified mood [affective] disorder: Secondary | ICD-10-CM | POA: Insufficient documentation

## 2022-04-06 DIAGNOSIS — G8929 Other chronic pain: Secondary | ICD-10-CM | POA: Insufficient documentation

## 2022-04-06 DIAGNOSIS — I10 Essential (primary) hypertension: Secondary | ICD-10-CM | POA: Insufficient documentation

## 2022-04-06 DIAGNOSIS — M159 Polyosteoarthritis, unspecified: Secondary | ICD-10-CM | POA: Insufficient documentation

## 2022-04-06 MED ORDER — LISINOPRIL 10 MG TABLET
10.0000 mg | ORAL_TABLET | Freq: Every day | ORAL | 1 refills | Status: DC
Start: 2022-04-06 — End: 2022-07-06

## 2022-04-06 MED ORDER — HYDROCODONE 7.5 MG-ACETAMINOPHEN 325 MG TABLET
1.0000 | ORAL_TABLET | Freq: Three times a day (TID) | ORAL | 0 refills | Status: DC | PRN
Start: 2022-04-06 — End: 2022-05-08

## 2022-04-06 MED ORDER — FLUOXETINE 10 MG CAPSULE
10.0000 mg | ORAL_CAPSULE | Freq: Every day | ORAL | 1 refills | Status: DC
Start: 2022-04-06 — End: 2022-07-06

## 2022-04-06 MED ORDER — SIMVASTATIN 40 MG TABLET
40.0000 mg | ORAL_TABLET | Freq: Every evening | ORAL | 1 refills | Status: DC
Start: 2022-04-06 — End: 2022-07-06

## 2022-04-06 NOTE — Progress Notes (Signed)
[facility]NAL [facility]ICINE, [facility]  [address]  [place] [address]    History and Physical     Name: Gregory Case MRN:  [id number]   Date: 04/06/2022 Age: 67 y.o.               Follow Up        Reason for Visit: Follow Up (1 month follow up ) and Medication Refill     History of Present Illness  Gregory Case is a 66 y.o. male who is being seen today in the office for follow-up concerning blood pressure/hypertension with reading today 118/70 .Patient also monitors outpatient states readings are good. Overall he feels well denies any headaches dizziness chest pain. Continues to take prinivil 10 mg po daily.    F/U chronic anxiety/depresion w pt on prozac 10 mg daily as well as clonazepam 1mg  prn. Pt states he is doing well at this time and denies any panic attacks or increased anxiety/depression sx.     Follow-up chronic back pain/DJD/OA for which patient has been on long-term narcotic use for multiple years tolerated ;hx of TKR noted in past ; patient states he still has arthritic pains in the hands knees and sometimes between dosing of Norco he uses Duexis or motrin. States at this time back pain is stable  and no new issues to report and medication refills requested. No  abuse history noted and patient compliant with urine drug screen.    Colonoscopy up-to-date; will be due again in 2026    Flu shot up to date    PHQ Questionnaire         Past Medical History:   Diagnosis Date    Abnormal glucose level     Anxiety     Chronic back pain     COVID-19 vaccine series completed     Depression     Mixed hyperlipidemia     Osteoarthritis          Past Surgical History:   Procedure Laterality Date    COLONOSCOPY W/ POLYPECTOMY      HX BACK SURGERY      HX CARPAL TUNNEL RELEASE      HX KNEE REPLACMENT Right     HX TONSILLECTOMY        Family Medical History:       Problem Relation (Age of Onset)    Coronary Artery Disease Mother    Diabetes type II Mother    Hypertension (High Blood Pressure) Other             Social History     Tobacco Use    Smoking status: Never    Smokeless tobacco: Never   Substance Use Topics    Alcohol use: Yes     Comment: few times a week    Drug use: Never     Medication:  benzonatate (TESSALON) 100 mg Oral Capsule, Take 1 Capsule (100 mg total) by mouth Three times a day as needed for Cough  clonazePAM (KLONOPIN) 1 mg Oral Tablet, Take 1 Tablet (1 mg total) by mouth Every night  HYDROcodone-acetaminophen (NORCO) 7.5-325 mg Oral Tablet, Take 1 Tablet by mouth Three times a day as needed for Pain for up to 30 days  omega 3-dha-epa-fish oil 300 mg (120 mg- 180mg )-1,000 mg Oral Capsule, Take 1 Capsule by mouth Once a day  FLUoxetine (PROZAC) 10 mg Oral Capsule, Take 1 Capsule (10 mg total) by mouth Once a day  lisinopriL (PRINIVIL) 10 mg Oral Tablet,  Take 1 Tablet (10 mg total) by mouth Once a day  simvastatin (ZOCOR) 40 mg Oral Tablet, Take 1 Tablet (40 mg total) by mouth Every night    No facility-administered medications prior to visit.    Allergies:  Allergies   Allergen Reactions    Meloxicam Hives/ Urticaria       Physical Exam:  Vitals:    04/06/22 0905   BP: 118/80   Pulse: 64   SpO2: 96%   Weight: 79.8 kg (176 lb)   Height: 1.651 m (5\' 5" )   BMI: 29.35        Physical Exam  Vitals and nursing note reviewed.   Constitutional:       General: He is not in acute distress.     Appearance: Normal appearance.   HENT:      Right Ear: Tympanic membrane normal.      Left Ear: Tympanic membrane normal.      Mouth/Throat:      Mouth: Mucous membranes are moist.      Pharynx: Oropharynx is clear.   Eyes:      Pupils: Pupils are equal, round, and reactive to light.   Cardiovascular:      Rate and Rhythm: Normal rate and regular rhythm.      Pulses: Normal pulses.      Heart sounds: Normal heart sounds.   Pulmonary:      Effort: Pulmonary effort is normal.      Breath sounds: Normal breath sounds.   Abdominal:      General: Bowel sounds are normal.      Palpations: Abdomen is soft.      Tenderness:  There is no abdominal tenderness.   Musculoskeletal:         General: No swelling or tenderness. Normal range of motion.      Cervical back: Normal range of motion and neck supple.      Comments: Chronic lower back pain with forward flexion extension which is limited  Negative straight leg raise bilateral  Arthritic changes bilateral hands and knees   Skin:     General: Skin is warm.      Findings: No lesion or rash.   Neurological:      General: No focal deficit present.      Mental Status: He is alert and oriented to person, place, and time.   Psychiatric:         Mood and Affect: Mood normal.         Behavior: Behavior normal.         Thought Content: Thought content normal.         Judgment: Judgment normal.         Assessment/Plan:  Problem List Items Addressed This Visit          Cardiovascular System    Essential hypertension - Primary       Musculoskeletal    Osteoarthritis    Chronic back pain       Psychiatric    Mood disorder (CMS HCC)       Labs up to date  Meds refilled as noted  Screening test up to date  F/u 1 month    Post-Discharge Follow Up Appointments       Thursday May 04, 2022    Return Patient Visit with Gregory Molt, PA-C at  8:30 AM      Tuesday May 30, 2022    Return Telephone Visit with Gregory Molt, PA-C at  1:00 PM      Monday Jul 03, 2022    Return Patient Visit with Gregory Molt, PA-C at  8:00 AM      Internal Medicine, [facility]  [facility]  [address]  [place] [address]  [phone]               Seek medical attention for new or worsening symptoms.  Patient has been seen in this clinic within the last 3 years.     Gregory Fiske, PA-C          This note was partially created using MModal Fluency Direct system (voice recognition software) and is inherently subject to errors including those of syntax and "sound-alike" substitutions which may escape proofreading.  In such instances, original meaning may be extrapolated by contextual derivation.  Physical Exam  Vitals and  nursing note reviewed.   Constitutional:       General: He is not in acute distress.     Appearance: Normal appearance.   HENT:      Right Ear: Tympanic membrane normal.      Left Ear: Tympanic membrane normal.      Mouth/Throat:      Mouth: Mucous membranes are moist.      Pharynx: Oropharynx is clear.   Eyes:      Pupils: Pupils are equal, round, and reactive to light.   Cardiovascular:      Rate and Rhythm: Normal rate and regular rhythm.      Pulses: Normal pulses.      Heart sounds: Normal heart sounds.   Pulmonary:      Effort: Pulmonary effort is normal.      Breath sounds: Normal breath sounds.   Abdominal:      General: Bowel sounds are normal.      Palpations: Abdomen is soft.      Tenderness: There is no abdominal tenderness.   Musculoskeletal:         General: No swelling or tenderness. Normal range of motion.      Cervical back: Normal range of motion and neck supple.      Comments: Chronic lower back pain with forward flexion extension which is limited  Negative straight leg raise bilateral  Arthritic changes bilateral hands and knees   Skin:     General: Skin is warm.      Findings: No lesion or rash.   Neurological:      General: No focal deficit present.      Mental Status: He is alert and oriented to person, place, and time.   Psychiatric:         Mood and Affect: Mood normal.         Behavior: Behavior normal.         Thought Content: Thought content normal.         Judgment: Judgment normal.

## 2022-04-06 NOTE — Telephone Encounter (Signed)
Patient is here for routine 1 month follow up for medication refills.

## 2022-05-04 ENCOUNTER — Ambulatory Visit (INDEPENDENT_AMBULATORY_CARE_PROVIDER_SITE_OTHER): Payer: Self-pay | Admitting: Physician Assistant

## 2022-05-08 ENCOUNTER — Ambulatory Visit: Payer: Medicare PPO | Attending: Physician Assistant | Admitting: Physician Assistant

## 2022-05-08 ENCOUNTER — Encounter (INDEPENDENT_AMBULATORY_CARE_PROVIDER_SITE_OTHER): Payer: Self-pay | Admitting: Physician Assistant

## 2022-05-08 ENCOUNTER — Other Ambulatory Visit (INDEPENDENT_AMBULATORY_CARE_PROVIDER_SITE_OTHER): Payer: Self-pay | Admitting: Physician Assistant

## 2022-05-08 ENCOUNTER — Other Ambulatory Visit: Payer: Self-pay

## 2022-05-08 VITALS — BP 124/80 | HR 77 | Temp 96.4°F

## 2022-05-08 DIAGNOSIS — R059 Cough, unspecified: Secondary | ICD-10-CM | POA: Insufficient documentation

## 2022-05-08 DIAGNOSIS — G8929 Other chronic pain: Secondary | ICD-10-CM | POA: Insufficient documentation

## 2022-05-08 DIAGNOSIS — M25561 Pain in right knee: Secondary | ICD-10-CM | POA: Insufficient documentation

## 2022-05-08 DIAGNOSIS — F39 Unspecified mood [affective] disorder: Secondary | ICD-10-CM | POA: Insufficient documentation

## 2022-05-08 DIAGNOSIS — R0981 Nasal congestion: Secondary | ICD-10-CM | POA: Insufficient documentation

## 2022-05-08 DIAGNOSIS — M545 Low back pain, unspecified: Secondary | ICD-10-CM | POA: Insufficient documentation

## 2022-05-08 DIAGNOSIS — I10 Essential (primary) hypertension: Secondary | ICD-10-CM | POA: Insufficient documentation

## 2022-05-08 MED ORDER — AZITHROMYCIN 250 MG TABLET
ORAL_TABLET | ORAL | 0 refills | Status: DC
Start: 2022-05-08 — End: 2022-06-08

## 2022-05-08 MED ORDER — TRIAMCINOLONE ACETONIDE 40 MG/ML SUSPENSION FOR INJECTION
40.0000 mg | INTRAMUSCULAR | Status: DC
Start: 2022-05-08 — End: 2022-05-08

## 2022-05-08 MED ORDER — METHYLPREDNISOLONE ACETATE 80 MG/ML SUSPENSION FOR INJECTION
80.0000 mg | INTRAMUSCULAR | Status: AC
Start: 2022-05-08 — End: 2022-05-08
  Administered 2022-05-08: 80 mg via INTRAMUSCULAR

## 2022-05-08 MED ORDER — CLONAZEPAM 1 MG TABLET
1.0000 mg | ORAL_TABLET | Freq: Every evening | ORAL | 2 refills | Status: DC
Start: 2022-05-08 — End: 2022-06-08

## 2022-05-08 NOTE — Nursing Note (Signed)
Patient is in the office today for 28mCDM for refill on medication

## 2022-05-08 NOTE — Nursing Note (Signed)
Pt here for 30mCDM. Pt stated refills are needed

## 2022-05-08 NOTE — Progress Notes (Signed)
[facility]  [address]  [place] [id number]    History and Physical     Name: Gregory Case MRN:  [id number]   Date: 05/08/2022 Age: 67 y.o.               Follow Up        Reason for Visit: Follow Up med refills; sinus drainage cough; knee pain    History of Present Illness  Gregory Case is a 67 y.o. male who is being seen today in the office for follow-up as well as complaints of sinus  cong /drainage cough and RT knee pain times 3-4 days. Pt states they were at the beach last week so temp was diff and upon coming back started having a lot of drainage w cough noted. Sputum little thick but grey/white in color. No sinus pain noted. No fever chills.  Pt also states his RT knee has been hurting as well but states he was up on his feet a lot during vacation and played golf several times so thinks most likely wide bothering him.  He denies any fall or injury.  The knee is not red hot or swollen in history of previous knee replacement noted.    Follow-up blood pressure/hypertension with reading today 124/80 .Patient also monitors outpatient states readings are good. Overall he feels well denies any headaches dizziness chest pain. Continues to take prinivil 10 mg po daily.    F/U chronic anxiety/depresion w pt on prozac 10 mg daily as well as clonazepam 1mg  prn. Pt states he is doing well at this time and denies any panic attacks or increased anxiety/depression sx.  Needs Klonopin refilled.    Follow-up chronic back pain/DJD/OA for which patient has been on long-term narcotic use for multiple years tolerated ;hx of TKR noted in past ; patient states he still has arthritic pains in the hands knees and sometimes between dosing of Norco he uses Duexis or motrin. States at this time back pain is stable  and no new issues to report and medication refills requested. No  abuse history noted and patient compliant with urine drug screen.    Colonoscopy up-to-date; will be due again in  2026    Flu shot up to date    PHQ Questionnaire         Past Medical History:   Diagnosis Date    Abnormal glucose level     Anxiety     Chronic back pain     COVID-19 vaccine series completed     Depression     Mixed hyperlipidemia     Osteoarthritis          Past Surgical History:   Procedure Laterality Date    COLONOSCOPY W/ POLYPECTOMY      HX BACK SURGERY      HX CARPAL TUNNEL RELEASE      HX KNEE REPLACMENT Right     HX TONSILLECTOMY        Family Medical History:       Problem Relation (Age of Onset)    Coronary Artery Disease Mother    Diabetes type II Mother    Hypertension (High Blood Pressure) Other            Social History     Tobacco Use    Smoking status: Never    Smokeless tobacco: Never   Substance Use Topics    Alcohol use: Yes     Comment: few times a week  Drug use: Never     Medication:  FLUoxetine (PROZAC) 10 mg Oral Capsule, Take 1 Capsule (10 mg total) by mouth Once a day  lisinopriL (PRINIVIL) 10 mg Oral Tablet, Take 1 Tablet (10 mg total) by mouth Once a day  omega 3-dha-epa-fish oil 300 mg (120 mg- 180mg )-1,000 mg Oral Capsule, Take 1 Capsule by mouth Once a day  simvastatin (ZOCOR) 40 mg Oral Tablet, Take 1 Tablet (40 mg total) by mouth Every night  benzonatate (TESSALON) 100 mg Oral Capsule, Take 1 Capsule (100 mg total) by mouth Three times a day as needed for Cough  clonazePAM (KLONOPIN) 1 mg Oral Tablet, Take 1 Tablet (1 mg total) by mouth Every night  HYDROcodone-acetaminophen (NORCO) 7.5-325 mg Oral Tablet, Take 1 Tablet by mouth Three times a day as needed for Pain for up to 30 days    No facility-administered medications prior to visit.    Allergies:  Allergies   Allergen Reactions    Meloxicam Hives/ Urticaria       Physical Exam:  Vitals:    05/08/22 0922   BP: 124/80   Pulse: 77   Temp: (!) 35.8 C (96.4 F)   SpO2: 97%        Physical Exam  Vitals and nursing note reviewed.   Constitutional:       General: He is not in acute distress.     Appearance: Normal appearance.    HENT:      Right Ear: Tympanic membrane normal.      Left Ear: Tympanic membrane normal.      Mouth/Throat:      Mouth: Mucous membranes are moist.      Pharynx: Oropharynx is clear.   Eyes:      Pupils: Pupils are equal, round, and reactive to light.   Cardiovascular:      Rate and Rhythm: Normal rate and regular rhythm.      Pulses: Normal pulses.      Heart sounds: Normal heart sounds.   Pulmonary:      Effort: Pulmonary effort is normal.      Breath sounds: Normal breath sounds.   Abdominal:      General: Bowel sounds are normal.      Palpations: Abdomen is soft.      Tenderness: There is no abdominal tenderness.   Musculoskeletal:         General: No swelling or tenderness. Normal range of motion.      Cervical back: Normal range of motion and neck supple.      Comments: Chronic lower back pain with forward flexion extension which is limited  Negative straight leg raise bilateral  Arthritic changes bilateral hands and knees  Right knee without redness warmth or swelling  Range of motion intact  Gait stable   Skin:     General: Skin is warm.      Findings: No lesion or rash.   Neurological:      General: No focal deficit present.      Mental Status: He is alert and oriented to person, place, and time.   Psychiatric:         Mood and Affect: Mood normal.         Behavior: Behavior normal.         Thought Content: Thought content normal.         Judgment: Judgment normal.         Assessment/Plan:  Problem List Items Addressed This Visit  Chronic back pain    Essential hypertension    Mood disorder (CMS HCC)     Other Visit Diagnoses       Sinus congestion    -  Primary    Cough, unspecified type        Arthralgia of right knee                  Labs up to date  Depo-Medrol 80 mg IM given for sinus congestion and knee pain  Continue topical anti-inflammatories along with pain medication for knee pain  Continue over-the-counter allergy medications daily as directed  Z-Pak as directed to hold if sinus symptoms  become worse may use as directed  Meds refilled as noted  Screening test up to date  F/u symptoms no better 1 week otherwise as scheduled    Post-Discharge Follow Up Appointments         Tuesday May 30, 2022    Return Telephone Visit with Eyvonne Mechaniclvis, Amy, PA-C at  1:00 PM      Monday Jul 03, 2022    Return Patient Visit with Eyvonne MechanicAlvis, Amy, PA-C at  8:00 AM      Internal Medicine, Building [facility]  [facility], [place]  [address]  [place] [id number]  [phone]               Seek medical attention for new or worsening symptoms.  Patient has been seen in this clinic within the last 3 years.     Amy Hyman BibleAlvis, PA-C      I personally reviewed the documentation of care provided by the certified physician assistant and I agree with her medical decision making, Weyman PedroLaDonna Marquize Seib, D.O.       This note was partially created using MModal Fluency Direct system (voice recognition software) and is inherently subject to errors including those of syntax and "sound-alike" substitutions which may escape proofreading.  In such instances, original meaning may be extrapolated by contextual derivation.

## 2022-05-09 MED ORDER — HYDROCODONE 7.5 MG-ACETAMINOPHEN 325 MG TABLET
1.0000 | ORAL_TABLET | Freq: Three times a day (TID) | ORAL | 0 refills | Status: DC | PRN
Start: 2022-05-09 — End: 2022-06-08

## 2022-05-30 ENCOUNTER — Other Ambulatory Visit: Payer: Self-pay

## 2022-05-30 ENCOUNTER — Ambulatory Visit: Payer: Medicare HMO | Attending: Physician Assistant | Admitting: Physician Assistant

## 2022-05-30 ENCOUNTER — Encounter (INDEPENDENT_AMBULATORY_CARE_PROVIDER_SITE_OTHER): Payer: Self-pay | Admitting: Physician Assistant

## 2022-05-30 DIAGNOSIS — Z0000 Encounter for general adult medical examination without abnormal findings: Secondary | ICD-10-CM

## 2022-05-30 NOTE — Patient Instructions (Signed)
Medicare Preventive Services  Medicare coverage information Recommendation for YOU   Heart Disease and Diabetes   Lipid profile every 5 years or more often if at risk for cardiovascular disease  Lab Results   Component Value Date    CHOLESTEROL 146 01/09/2022    HDLCHOL 52 01/09/2022    LDLCHOL 81 01/09/2022    TRIG 67 01/09/2022       Diabetes Screening with Blood Glucose test or Glucose Tolerance Test Yearly for those at risk for diabetes, up to two tests per year for those with prediabetes  Last Glucose: 105     Diabetes Self-Management Training   Initial training ten hours per year, and follow-up training two hours per subsequent year. Optional for those with diabetes    Medical Nutrition Therapy   Three hours of one-on-one counseling in first year, two hours in subsequent years. Optional for those with diabetes, kidney disease   Intensive Behavioral Therapy for Obesity  Face-to-face counseling, first month every week, month 2-6 every other week, month 7-12 every month if continued progress is documented Optional for those with Body Mass Index 30 or higher  Your There is no height or weight on file to calculate BMI.   Tobacco Cessation (Quitting) Counseling   Two attempts per year, max 4 sessions per attempt, up to 8 sessions per year Optional for those who use tobacco    Cancer Screening Last Completion Date   Colorectal screening   For anyone age 59 to 48 or any age if high risk:  Screening Colonoscopy every 10 yrs if low risk,  more frequent if higher risk  OR  Cologuard Stool DNA test once every 3 years OR  Fecal Occult Blood Testing yearly OR  Flexible  Sigmoidoscopy  every 5 yr OR  CT Colonography every 5 yrs  --06/30/2019  See below for due date if applicable.   Prostate Cancer Screening  Prostate Specific Antigen blood test based on joint decision making with your provider for ages 23-69  A joint decision between you and your primary care provider   Lung Cancer Screening  Annual low dose computed  tomography (LDCT scan) is recommended for those age 24-80 who smoked 20 pack-years and are current smokers or quit smoking within past 15 years, after counseling by your doctor or nurse clinician about the possible benefits or harms. ?  See below for due date if applicable.   Vaccinations   Respiratory syncytial virus (RSV)  Age 36 years or older: Based on shared clinical decision-making with your provider.  Pneumococcal Vaccine  Recommended routinely age 31+ with one or two separate vaccines based on your risk. Recommended before age 64 if medical conditions with increased risk  Seasonal Influenza Vaccine  Once every flu season   Hepatitis B Vaccine  3 doses if risk (including anyone with diabetes or liver disease)  Shingles Vaccine  Two doses at age 58 or older  Diphtheria Tetanus Pertussis Vaccine  ONCE as adult, booster every 10 years     Immunization History   Administered Date(s) Administered   . Covid-19 Vaccine,Moderna Bivalent 12/11/2019   . Covid-19 Vaccine,Moderna,12 Years+ 12/11/2019   . Covid-19 Vaccine,Pfizer-BioNTech,Purple Top,88yrs+ 04/04/2019, 05/02/2019   . HEPATITIS A VACCINE -ADULT 07/16/2013, 03/18/2014   . Influenza Vaccine, 6 month-adult 12/16/2018, 12/10/2019, 12/22/2020, 12/12/2021   . MEASLES/MUMPS/RUBELLA VIRUS VACCINE 08/07/2013   . TYPHOID VACCINE INJECTION (ADMIN) 07/16/2013   . Tetanus,Diptheria,Pertussis(BOOSTRIX) 07/16/2013   . Typhoid Vaccine Oral-2 BL unit cap (ADMIN) 07/16/2013  Shingles vaccine and Diphtheria Tetanus Pertussis vaccines are available at pharmacies or local health department without a prescription.   Other Preventative Screening  Last Completion Date   Glaucoma Screening   Yearly if in high risk group such as diabetes, family history, African American age 32+ or Hispanic American age 4+ See your Eye Care Provider   Hepatitis C Screening   Recommended  for those born between ages 18-79 years. ?  See below for due date if applicable.     HIV Testing  Recommended  routinely at least ONCE, covered every year for age 15 to 36 regardless of risk, and every year for age over 61 who ask for the test or higher risk. Yearly or up to 3 times in pregnancy  ?  See below for due date if applicable.  Bone Densitometry   Screening is recommended for Men ages 17 and above with one or more risk factor: androgen deprivation therapy for prostate cancer, hypogonadism, frailty, primary hyperparathyroidism, hyperthyroidism  For men diagnosed with osteoporosis, follow up is recommended every years or a frequency recommended by your provider   ?  See below for due date if applicable.   Abdominal Aortic Aneurysm Screening Ultrasound   Once with a family history of abdominal aortic aneurysms OR a male between65-75 and have smoked at least 100 cigarettes in your lifetime.   ?  See below for due date if applicable.       Your Personalized Schedule for Preventive Tests   Health Maintenance: Pending and Last Completed       Date Due Completion Date    Medicare Annual Wellness Visit 05/30/2023 05/30/2022    Adult Tdap-Td (2 - Td or Tdap) 07/17/2023 07/16/2013    Prostate Cancer Screening 01/10/2024 01/09/2022    Colonoscopy 06/29/2029 06/30/2019           Non-Opioid Treatment for Chronic Pains   Treatment for chronic pain can be managed without opioids. Below are non-opioid options that may be considered and discussed with your provider to determine which options would be best for your health.    Over the counter or presciptions medications:  Acetaminophen (Tylenol) or Non-steroidal anti-inflammatories such as: Ibuprofen (Motrin, Advil), naproxen (Aleve), aspirin  Antidepressants such as amitriptyline, nortriptyline (Pamelor),  Doxepin (Silenor), Imipramine (Tofranil) and others.  Anticonvulsant Nerve pain medications: Gabapentin (Neurontin), pregabalin (Lyrica)  Externally applied medications such as NSAID'S, lidocaine, capsaisin, and others  Injections: pain specialists can sometimes inject medications at  the site of pain.    Alternative therapies such as  Acupuncture  Osteopathic manipulation  Chiropractic  Massage therapy       For Information on Advanced Directives for Health Care:  [place]:  [email or web address]  [place], [place], [place], [place] General Information: [email or web address]

## 2022-05-30 NOTE — Progress Notes (Signed)
INTERNAL MEDICINE, [facility]  [address]  [place] [address]  Operated by [facility]  Medicare Annual Wellness Visit    Name: Gregory Case MRN:  [id number]   Date: 05/30/2022 Age: 67 y.o.       SUBJECTIVE:   Gregory Case is a 67 y.o. male for presenting for Medicare Wellness exam.   I have reviewed and reconciled the medication list with the patient today.    TELEMEDICINE DOCUMENTATION:    [facility]    Patient/family aware of provider location:  yes  Patient/family consent for telemedicine:  yes  Examination observed and performed by:  Cleophus Molt, PA-C         05/30/2022     1:42 PM   Comprehensive Health Assessment-Adult   Do you wish to complete this form? Yes   During the past 4 weeks, how would you rate your health in general? Very Good   During the past 4 weeks, how much difficulty have you had doing your usual activities inside and outside your home because of medical or emotional problems? A little bit of difficulty   During the past 4 weeks, was someone available to help you if you needed and wanted help? Yes, as much as I wanted   In the past year, how many times have you gone to the emergency department or been admitted to a hospital for a health problem? None   Are you generally satisfied with your sleep? Yes   Do you have enough money to buy things you need in everyday life, such as food, clothing, medicines, and housing? Yes, always   Can you get to places beyond walking distance without help?  (For example, can you drive your own car or travel alone on buses)? Yes   Do you fasten your seatbelt when you are in a car? Yes, usually   Do you exercise 20 minutes 3 or more days per week (such as walking, dancing, biking, mowing grass, swimming)? Yes, most of the time   How often do you eat food that is healthy (fruits, vegetables, lean meats) instead of unhealthy (sweets, fast food, junk food, fatty foods)? Most of the time   Have your parents, brothers or  sisters had any of the following problems before the age of 22? (check all that apply) Diabetes (sugar);High cholesterol;Alcohol or drug addiction (or abuse)   How often do you have trouble taking medicines the eay you are told to take them? I always take them as prescribed   Do you need any help communicating with your doctors and nurses because of vision or hearing problems? No   During the past 12 months, have you experienced confusion or memory loss that is happening more often or is getting worse? No   Do you have one person you think of as your personal doctor (primary care provider or family doctor)? Yes   If you are seeing a Primary Care Provider (PCP) or family doctor. please list their name Derya Dettmann   Are you now also seeing any specialist physician(s) (such as eye doctor, foot doctor, skin doctor)? No   How confident are you that you can control or manage most of your health problems? Very confident         I have reviewed and updated as appropriate the past medical, family and social history. 05/30/2022 as summarized below:  Past Medical History:   Diagnosis Date    Abnormal glucose level  Anxiety     Chronic back pain     COVID-19 vaccine series completed     Depression     Mixed hyperlipidemia     Osteoarthritis      Past Surgical History:   Procedure Laterality Date    Colonoscopy w/ polypectomy      Hx back surgery      Hx carpal tunnel release      Hx knee replacment Right     Hx tonsillectomy       Current Outpatient Medications   Medication Sig    azithromycin (ZITHROMAX) 250 mg Oral Tablet Take 500 mg (2 tab) on day 1; take 250 mg (1 tab) on days 2-5.    benzonatate (TESSALON) 100 mg Oral Capsule Take 1 Capsule (100 mg total) by mouth Three times a day as needed for Cough    clonazePAM (KLONOPIN) 1 mg Oral Tablet Take 1 Tablet (1 mg total) by mouth Every night    FLUoxetine (PROZAC) 10 mg Oral Capsule Take 1 Capsule (10 mg total) by mouth Once a day    HYDROcodone-acetaminophen (NORCO) 7.5-325  mg Oral Tablet Take 1 Tablet by mouth Three times a day as needed for Pain for up to 30 days    lisinopriL (PRINIVIL) 10 mg Oral Tablet Take 1 Tablet (10 mg total) by mouth Once a day    omega 3-dha-epa-fish oil 300 mg (120 mg- 180mg )-1,000 mg Oral Capsule Take 1 Capsule by mouth Once a day    simvastatin (ZOCOR) 40 mg Oral Tablet Take 1 Tablet (40 mg total) by mouth Every night     Family Medical History:       Problem Relation (Age of Onset)    Coronary Artery Disease Mother    Diabetes type II Mother    Hypertension (High Blood Pressure) Other            Social History     Socioeconomic History    Marital status: Married   Tobacco Use    Smoking status: Never    Smokeless tobacco: Never   Substance and Sexual Activity    Alcohol use: Yes     Comment: few times a week    Drug use: Never    Sexual activity: Yes     Partners: Female     Social Determinants of Health     Health Literacy: Medium Risk (11/14/2021)    Health Literacy     SDOH Health Literacy: Occasionally         List of Current Health Care Providers   Care Team       PCP       Name Type Specialty Phone Number    Cleophus Molt, [place] Physician Assistant [place] [phone]              Care Team       No care team found                      Health Maintenance   Topic Date Due    Medicare Annual Wellness Visit  05/30/2023    Adult Tdap-Td (2 - Td or Tdap) 07/17/2023    Prostate Cancer Screening  01/10/2024    Colonoscopy  06/29/2029    Influenza Vaccine  Completed    Meningococcal Vaccine  Aged Out    Hepatitis C screening  Discontinued    Shingles Vaccine  Discontinued    Covid-19 Vaccine  Discontinued    Pneumococcal Vaccination,  Age 81+  Discontinued     Medicare Wellness Assessment   Medicare initial or wellness physical in the last year?: Yes  Advance Directives   Does patient have a living will or MPOA: Yes                  Activities of Daily Living   Do you need help with dressing, bathing, or walking?: No   Do you need help with  shopping, housekeeping, medications, or finances?: No   Do you have rugs in hallways, broken steps, or poor lighting?: No   Do you have grab bars in your bathroom, non-slip strips in your tub, and hand rails on your stairs?: No   Cognitive Function Screen (1=Yes, 0=No)   What is you age?: Correct   What is the time to the nearest hour?: Correct   What is the year?: Correct   What is the name of this clinic?: Incorrect   Can the patient recognize two persons (the doctor, the nurse, home help, etc.)?: Correct   What is the date of your birth? (day and month sufficient) : Correct   In what year did World War II end?: Correct   Who is the current president of the [place]?: Correct   Count from 20 down to 1?: Correct   What address did I give you earlier?: Correct   Total Score: 9   Interpretation of Total Score: Greater than 6 Normal   Fall Risk Screen   Do you feel unsteady when standing or walking?: No  Do you worry about falling?: No  Have you fallen in the past year?: No   Depression Screen     Little interest or pleasure in doing things.: More than half the days  Feeling down, depressed, or hopeless: Not at all  PHQ 2 Total: 2     Pain Score   Pain Score:   4    Substance Use-Abuse Screening     Tobacco Use     In Past 12 MONTHS, how often have you used any tobacco product (for example, cigarettes, e-cigarettes, cigars, pipes, or smokeless tobacco)?: Never     Alcohol use     In the PAST 12 MONTHS, how often have you had 5 (men)/4 (women) or more drinks containing alcohol in one day?: Never     Prescription Drug Use     In the PAST 12 months, how often have you used any prescription medications just for the feeling, more than prescribed, or that were not prescribed for you? Prescriptions may include: opioids, benzodiazepines, medications for ADHD: Never           Illicit Drug Use   In the PAST 12 MONTHS, how often have you used any drugs, including marijuana, cocaine or crack, heroin, methamphetamine,  hallucinogens, ecstasy/MDMA?: Never        Hearing Screen   Have you noticed any hearing difficulties?: No  After whispering 9-1-6 how many numbers did the patient repeat correctly?: 3       Vision Screen             Urine Incontinence Screen   Urinary Incontinence Screen  Do you ever leak urine when you don't want to?: No                      OBJECTIVE:   There were no vitals taken for this visit.       Other appropriate exam:    There  are no preventive care reminders to display for this patient.     ASSESSMENT & PLAN:   Assessment/Plan   1. Medicare annual wellness visit, subsequent       Identified Risk Factors/ Recommended Actions           Opioid use plan of care:         Opioids use: Plan: Assessment of pain completed and pain controlled-patient's continue current pain medication and dosing          No orders of the defined types were placed in this encounter.         The patient has been educated about risk factors and recommended preventive care. Written Prevention Plan completed/ updated and given to patient (see After Visit Summary).    Total time on visit 30 minutes      Post-Discharge Follow Up Appointments       Thursday Jun 08, 2022    Return Patient Visit with Cleophus Molt, PA-C at 10:30 AM      Thursday Jul 06, 2022    Return Patient Visit with Cleophus Molt, PA-C at  9:30 AM      Monday Aug 07, 2022    Return Patient Visit with Cleophus Molt, PA-C at  8:30 AM      Thursday May 31, 2023    Return Telephone Visit with Cleophus Molt, PA-C at  1:00 PM      Internal Medicine, Building A  [address] [place]  [place] [address]  [phone]           Medicare Preventive Services  Medicare coverage information Recommendation for YOU   Heart Disease and Diabetes   Lipid profile Every 5 years or more often if at risk for cardiovascular disease  Last Lipid Panel  (Last result in the past 2 years)        Cholesterol   HDL   LDL   Direct LDL   Triglycerides      01/09/22 0909 146   52   81      67             Diabetes Screening  yearly for those at risk for diabetes, 2 tests per year for those with prediabetes Last Glucose: 105    Diabetes Self Management Training or Medical Nutrition Therapy  For those with diabetes, up to 10 hrs initial training within a year, subsequent years up to 2 hrs of follow up training Optional for those with diabetes     Medical Nutrition Therapy Three hours of one-on-one counseling in first year, two hours in subsequent years Optional for those with diabetes, kidney disease   Intensive Behavioral Therapy for Obesity  Face-to-face counseling, first month every week, month 2-6 every other week, month 7-12 every month if continued progress is documented Optional for those with Body Mass Index 30 or higher  Your There is no height or weight on file to calculate BMI.   Tobacco Cessation (Quitting) Counseling   Two attempts per year, max 4 sessions per attempt, up to 8 per year, for those with tobacco-related health condition Optional for those that use tobacco   Cancer Screening   Colorectal screening   For anyone age 86 to 25 or any age if high risk:  Screening Colonoscopy every 10 yrs if low risk,  more frequent if higher risk  OR  Cologuard Stool DNA test once every 3 years OR  Fecal Occult Blood Testing yearly OR  Flexible  Sigmoidoscopy  every 5 yr OR  CT Colonography every 5 yrs    See your schedule below   Screening Pap Test Recommended every 3 years for all women age 11 to 68, or every five years if combined with HPV test (routine screening not needed after total hysterectomy).  Medicare covers every 2 years, up to yearly if high risk.  Screening Pelvic Exam Medicare covers every 2 years, yearly if high risk or childbearing age with abnormal Pap in last 3 yrs. See your schedule below   Screening Mammogram   Recommended every 2 years for women age 56 to 19, or more frequent if you have a higher risk. Selectively recommended for women between 40-49 based on shared decisions  about risk. Covered by Medicare up to every year for women age 31 or older See your schedule below      Lung Cancer Screening  Annual low dose computed tomography (LDCT scan) is recommended for those age 64-77 who smoked 20 pack-years and are current smokers or quit smoking within past 15 years (one pack-year= smoking one PPD for one year), after counseling by your doctor or nurse clinician about the possible benefits or harms. See your schedule below   Vaccinations   Pneumococcal Vaccine: Recommended routinely age 58+ with one or two separate vaccines based on your risk    Recommended before age 25 if medical conditions with increased risk  Seasonal Influenza Vaccine: Once every flu season   Hepatitis B Vaccine: 3 doses if risk (including anyone with diabetes or liver disease)  Shingles Vaccine: Two doses at age 60 or older  Diphtheria Tetanus Pertussis Vaccine: ONCE as adult, booster every 10 years     Immunization History   Administered Date(s) Administered    Covid-19 Vaccine,Moderna Bivalent 12/11/2019    Covid-19 Vaccine,Moderna,12 Years+ 12/11/2019    Covid-19 Vaccine,Pfizer-BioNTech,Purple Top,46yrs+ 04/04/2019, 05/02/2019    HEPATITIS A VACCINE -ADULT 07/16/2013, 03/18/2014    Influenza Vaccine, 6 month-adult 12/16/2018, 12/10/2019, 12/22/2020, 12/12/2021    MEASLES/MUMPS/RUBELLA VIRUS VACCINE 08/07/2013    TYPHOID VACCINE INJECTION (ADMIN) 07/16/2013    Tetanus,Diptheria,Pertussis(BOOSTRIX) 07/16/2013    Typhoid Vaccine Oral-2 BL unit cap (ADMIN) 07/16/2013     Shingles vaccine and Diphtheria Tetanus Pertussis vaccines are available at pharmacies or local health department without a prescription.   Other Screening   Bone Densitometry   Every 24 months for anyone at risk, including postmenopausal       Glaucoma Screening   Yearly if in high risk group such as diabetes, family history, African American age 74+ or Hispanic American age 62+   See your Eye Care Provider   Hepatitis C Screening recommended ONCE  for those born between 1945-1965, or high risk for HCV infection       HIV Testing recommended routinely at least ONCE, covered every year for age 28 to 78 regardless of risk, and every year for age over 22 who ask for the test or higher risk  Yearly or up to 3 times in pregnancy         Abdominal Aortic Aneurysm Screening Ultrasound   Once between the age of 64-75 with a family history of AAA       Your Personalized Schedule for Preventive Tests     Health Maintenance: Pending and Last Completed         Date Due Completion Date    Medicare Annual Wellness Visit 05/30/2023 05/30/2022    Adult Tdap-Td (2 - Td or Tdap) 07/17/2023  07/16/2013    Prostate Cancer Screening 01/10/2024 01/09/2022    Colonoscopy 06/29/2029 06/30/2019               Non-Opioid Treatment for Chronic Pains     Treatment for chronic pain can be managed without opioids. Below are non-opioid options that may be considered and discussed with your provider to determine which options would be best for your health.    Over the counter or presciptions medications:  Acetaminophen (Tylenol) or Non-steroidal anti-inflammatories such as: Ibuprofen (Motrin, Advil), naproxen (Aleve), aspirin  Antidepressants such as amitriptyline, nortriptyline (Pamelor),  Doxepin (Silenor), Imipramine (Tofranil) and others.  Anticonvulsant Nerve pain medications: Gabapentin (Neurontin), pregabalin (Lyrica)  Externally applied medications such as NSAID'S, lidocaine, capsaisin, and others  Injections: pain specialists can sometimes inject medications at the site of pain.    Alternative therapies such as  Acupuncture  Osteopathic manipulation  Chiropractic  Massage therapy           Corisa Montini, PA-C

## 2022-06-01 ENCOUNTER — Ambulatory Visit (INDEPENDENT_AMBULATORY_CARE_PROVIDER_SITE_OTHER): Payer: Self-pay | Admitting: Physician Assistant

## 2022-06-06 ENCOUNTER — Ambulatory Visit (INDEPENDENT_AMBULATORY_CARE_PROVIDER_SITE_OTHER): Payer: Self-pay | Admitting: Physician Assistant

## 2022-06-08 ENCOUNTER — Other Ambulatory Visit (INDEPENDENT_AMBULATORY_CARE_PROVIDER_SITE_OTHER): Payer: Self-pay | Admitting: Physician Assistant

## 2022-06-08 ENCOUNTER — Encounter (INDEPENDENT_AMBULATORY_CARE_PROVIDER_SITE_OTHER): Payer: Self-pay | Admitting: Physician Assistant

## 2022-06-08 ENCOUNTER — Ambulatory Visit: Payer: Medicare PPO | Attending: Physician Assistant | Admitting: Physician Assistant

## 2022-06-08 ENCOUNTER — Other Ambulatory Visit: Payer: Self-pay

## 2022-06-08 VITALS — BP 128/84 | HR 70 | Temp 97.0°F | Ht 65.0 in | Wt 177.0 lb

## 2022-06-08 DIAGNOSIS — F32A Depression, unspecified: Secondary | ICD-10-CM | POA: Insufficient documentation

## 2022-06-08 DIAGNOSIS — I10 Essential (primary) hypertension: Secondary | ICD-10-CM | POA: Insufficient documentation

## 2022-06-08 DIAGNOSIS — F419 Anxiety disorder, unspecified: Secondary | ICD-10-CM | POA: Insufficient documentation

## 2022-06-08 DIAGNOSIS — G8929 Other chronic pain: Secondary | ICD-10-CM | POA: Insufficient documentation

## 2022-06-08 DIAGNOSIS — M159 Polyosteoarthritis, unspecified: Secondary | ICD-10-CM | POA: Insufficient documentation

## 2022-06-08 DIAGNOSIS — M545 Low back pain, unspecified: Secondary | ICD-10-CM | POA: Insufficient documentation

## 2022-06-08 MED ORDER — CLONAZEPAM 1 MG TABLET
1.0000 mg | ORAL_TABLET | Freq: Every evening | ORAL | 2 refills | Status: DC
Start: 2022-06-08 — End: 2022-08-07

## 2022-06-08 MED ORDER — HYDROCODONE 7.5 MG-ACETAMINOPHEN 325 MG TABLET
1.0000 | ORAL_TABLET | Freq: Three times a day (TID) | ORAL | 0 refills | Status: DC | PRN
Start: 2022-06-08 — End: 2022-07-06

## 2022-06-08 NOTE — Telephone Encounter (Signed)
Patient is here for routine 1 month follow up for medication refills.

## 2022-06-08 NOTE — Nursing Note (Signed)
Patient is here for routine 1 month follow up for medication refills.

## 2022-06-08 NOTE — Progress Notes (Signed)
INTERNAL MEDICINE, [facility]  [address]  [place] [address]    History and Physical     Name: Gregory Case MRN:  [id number]   Date: 06/08/2022 Age: 67 y.o.               Follow Up        Reason for Visit: Follow Up (Routine 1 month )     History of Present Illness  Gregory Case is a 67 y.o. male who is being seen today in the office for follow-up concerning blood pressure/hypertension with reading today 128/84. Patient also monitors outpatient states readings are good. Overall he feels well denies any headaches dizziness chest pain. Continues to take prinivil 10 mg po daily.    F/U chronic anxiety/depresion w pt on prozac 10 mg daily as well as clonazepam 1mg  prn. Pt states he is doing well at this time and denies any panic attacks or increased anxiety/depression sx.     Follow-up chronic back pain/DJD/OA for which patient has been on long-term narcotic use for multiple years tolerated ;hx of TKR noted in past ; patient states he still has arthritic pains in the hands knees and sometimes between dosing of Norco he uses Duexis or motrin.  He is following with the back specialist and currently undergoing physical therapy as recommended prior to receiving injections. States at this time back pain is stable  and no new issues to report and medication refills requested. No  abuse history noted and patient compliant with urine drug screen.    - 06/02/2022 10:00 AM EDT  Formatting of this note is different from the original.  CARE TEAM:  Patient Care Team:  Rosalio Loud as PCP - General (Physician Assistant)    ASSESSMENT  1. Sacroiliitis  2. Lumbar spondylosis  3. Low back pain, unspecified back pain laterality, unspecified chronicity, unspecified whether sciatica present      Patient Active Problem List  Diagnosis  Biceps muscle tear, right, initial encounter    Impression:  Right Sacroiliitis  Right lower lumbar facet joint mediated pain  History of lumbar surgery 2010    PLAN  Treatment  Plan:  Orders Placed This Encounter  Spine Injection  BP Patient Education  BMI Patient Education     He has had partial benefit with the PT. I gave the patient the option of undergoing a diagnostic right sacroiliac joint steroid injection procedure under fluoroscopic guidance. I explained the alternatives, benefits, and risks of the procedure, including bleeding, infection, nerve injury, post-procedural pain, and allergic reaction. The patient agreed to proceed. The patient will follow up approximately two to four weeks after the procedure.  If the patient's symptoms do not improve, I may consider medial branch blocks and possible radiofrequency neurotomy vs a lumbar MRI study.    Follow-up: Return for follow up 2 weeks after injection.      Bethena Roys, MD     Colonoscopy up-to-date; will be due again in 2026    Flu shot up to date    PHQ Questionnaire         Past Medical History:   Diagnosis Date    Abnormal glucose level     Anxiety     Chronic back pain     COVID-19 vaccine series completed     Depression     Mixed hyperlipidemia     Osteoarthritis          Past Surgical History:   Procedure Laterality Date  COLONOSCOPY W/ POLYPECTOMY      HX BACK SURGERY      HX CARPAL TUNNEL RELEASE      HX KNEE REPLACMENT Right     HX TONSILLECTOMY        Family Medical History:       Problem Relation (Age of Onset)    Coronary Artery Disease Mother    Diabetes type II Mother    Hypertension (High Blood Pressure) Other            Social History     Tobacco Use    Smoking status: Never    Smokeless tobacco: Never   Substance Use Topics    Alcohol use: Yes     Comment: few times a week    Drug use: Never     Medication:  FLUoxetine (PROZAC) 10 mg Oral Capsule, Take 1 Capsule (10 mg total) by mouth Once a day  lisinopriL (PRINIVIL) 10 mg Oral Tablet, Take 1 Tablet (10 mg total) by mouth Once a day  omega 3-dha-epa-fish oil 300 mg (120 mg- 180mg )-1,000 mg Oral Capsule, Take 1 Capsule by mouth Once a day  simvastatin  (ZOCOR) 40 mg Oral Tablet, Take 1 Tablet (40 mg total) by mouth Every night  azithromycin (ZITHROMAX) 250 mg Oral Tablet, Take 500 mg (2 tab) on day 1; take 250 mg (1 tab) on days 2-5.  benzonatate (TESSALON) 100 mg Oral Capsule, Take 1 Capsule (100 mg total) by mouth Three times a day as needed for Cough  clonazePAM (KLONOPIN) 1 mg Oral Tablet, Take 1 Tablet (1 mg total) by mouth Every night  HYDROcodone-acetaminophen (NORCO) 7.5-325 mg Oral Tablet, Take 1 Tablet by mouth Three times a day as needed for Pain for up to 30 days    No facility-administered medications prior to visit.    Allergies:  Allergies   Allergen Reactions    Meloxicam Hives/ Urticaria       Physical Exam:  Vitals:    06/08/22 1019   BP: 128/84   Pulse: 70   Temp: 36.1 C (97 F)   TempSrc: Tympanic   SpO2: 96%   Weight: 80.3 kg (177 lb)   Height: 1.651 m (5\' 5" )   BMI: 29.52        Physical Exam  Vitals and nursing note reviewed.   Constitutional:       General: He is not in acute distress.     Appearance: Normal appearance.   HENT:      Right Ear: Tympanic membrane normal.      Left Ear: Tympanic membrane normal.      Mouth/Throat:      Mouth: Mucous membranes are moist.      Pharynx: Oropharynx is clear.   Eyes:      Pupils: Pupils are equal, round, and reactive to light.   Cardiovascular:      Rate and Rhythm: Normal rate and regular rhythm.      Pulses: Normal pulses.      Heart sounds: Normal heart sounds.   Pulmonary:      Effort: Pulmonary effort is normal.      Breath sounds: Normal breath sounds.   Abdominal:      General: Bowel sounds are normal.      Palpations: Abdomen is soft.      Tenderness: There is no abdominal tenderness.   Musculoskeletal:         General: No swelling or tenderness. Normal range of motion.  Cervical back: Normal range of motion and neck supple.      Comments: Chronic lower back pain with forward flexion extension which is limited  Negative straight leg raise bilateral  Arthritic changes bilateral hands  and knees   Skin:     General: Skin is warm.      Findings: No lesion or rash.   Neurological:      General: No focal deficit present.      Mental Status: He is alert and oriented to person, place, and time.   Psychiatric:         Mood and Affect: Mood normal.         Behavior: Behavior normal.         Thought Content: Thought content normal.         Judgment: Judgment normal.         Assessment/Plan:  Problem List Items Addressed This Visit       Osteoarthritis    Anxiety    Chronic back pain    Essential hypertension - Primary    Depression         Labs up to date  Meds refilled as noted  Screening test up to date  F/u 1 month    Post-Discharge Follow Up Appointments       Thursday Jul 06, 2022    Return Patient Visit with Eyvonne Mechanic, PA-C at  9:30 AM      Monday Aug 07, 2022    Return Patient Visit with Eyvonne Mechanic, PA-C at  8:30 AM      Tuesday Sep 05, 2022    Return Patient Visit with Eyvonne Mechanic, PA-C at  9:00 AM      Thursday Oct 05, 2022    Return Patient Visit with Eyvonne Mechanic, PA-C at  9:00 AM      Thursday Nov 02, 2022    Return Patient Visit with Eyvonne Mechanic, PA-C at  9:00 AM      Thursday Nov 30, 2022    Return Patient Visit with Eyvonne Mechanic, PA-C at  9:00 AM      Thursday May 31, 2023    Return Telephone Visit with Eyvonne Mechanic, PA-C at  1:00 PM      Internal Medicine, Building A  [facility]  [address]  [place] [address]  [phone]               Seek medical attention for new or worsening symptoms.  Patient has been seen in this clinic within the last 3 years.     Amy Hyman Bible, PA-C      I personally reviewed the documentation of care provided by the certified physician assistant and I agree with her medical decision making, Weyman Pedro, D.O.       This note was partially created using MModal Fluency Direct system (voice recognition software) and is inherently subject to errors including those of syntax and "sound-alike" substitutions which may escape proofreading.  In such  instances, original meaning may be extrapolated by contextual derivation.

## 2022-06-29 ENCOUNTER — Ambulatory Visit (INDEPENDENT_AMBULATORY_CARE_PROVIDER_SITE_OTHER): Payer: Self-pay | Admitting: Physician Assistant

## 2022-07-03 ENCOUNTER — Ambulatory Visit (INDEPENDENT_AMBULATORY_CARE_PROVIDER_SITE_OTHER): Payer: Self-pay | Admitting: Physician Assistant

## 2022-07-06 ENCOUNTER — Encounter (INDEPENDENT_AMBULATORY_CARE_PROVIDER_SITE_OTHER): Payer: Self-pay | Admitting: Physician Assistant

## 2022-07-06 ENCOUNTER — Ambulatory Visit: Payer: Medicare PPO | Attending: Physician Assistant | Admitting: Physician Assistant

## 2022-07-06 ENCOUNTER — Other Ambulatory Visit: Payer: Self-pay

## 2022-07-06 ENCOUNTER — Other Ambulatory Visit (INDEPENDENT_AMBULATORY_CARE_PROVIDER_SITE_OTHER): Payer: Self-pay | Admitting: Physician Assistant

## 2022-07-06 VITALS — BP 126/84 | HR 67 | Temp 97.4°F | Ht 65.0 in

## 2022-07-06 DIAGNOSIS — R7309 Other abnormal glucose: Secondary | ICD-10-CM | POA: Insufficient documentation

## 2022-07-06 DIAGNOSIS — M545 Low back pain, unspecified: Secondary | ICD-10-CM | POA: Insufficient documentation

## 2022-07-06 DIAGNOSIS — E782 Mixed hyperlipidemia: Secondary | ICD-10-CM | POA: Insufficient documentation

## 2022-07-06 DIAGNOSIS — Z79899 Other long term (current) drug therapy: Secondary | ICD-10-CM | POA: Insufficient documentation

## 2022-07-06 DIAGNOSIS — F32A Depression, unspecified: Secondary | ICD-10-CM | POA: Insufficient documentation

## 2022-07-06 DIAGNOSIS — F419 Anxiety disorder, unspecified: Secondary | ICD-10-CM | POA: Insufficient documentation

## 2022-07-06 DIAGNOSIS — G8929 Other chronic pain: Secondary | ICD-10-CM | POA: Insufficient documentation

## 2022-07-06 DIAGNOSIS — I10 Essential (primary) hypertension: Secondary | ICD-10-CM | POA: Insufficient documentation

## 2022-07-06 LAB — DRUG SCREEN, NO CONFIRMATION, URINE
AMPHET QL: NEGATIVE
BARB QL: NEGATIVE
BENZO QL: NEGATIVE
BUP QL: NEGATIVE
CANNAQL: POSITIVE — AB
COCQL: NEGATIVE
FENTANYL, RANDOM URINE: NEGATIVE
METHQL: NEGATIVE
OPIATE: POSITIVE — AB
OXYCODONE URINE: NEGATIVE
PCP QL: NEGATIVE

## 2022-07-06 LAB — URINALYSIS, MACROSCOPIC
BILIRUBIN: NEGATIVE mg/dL
BLOOD: NEGATIVE mg/dL
GLUCOSE: NEGATIVE mg/dL
KETONES: NEGATIVE mg/dL
LEUKOCYTES: NEGATIVE WBCs/uL
NITRITE: NEGATIVE
PH: 6 (ref 5.0–9.0)
PROTEIN: NEGATIVE mg/dL
SPECIFIC GRAVITY: 1.008 (ref 1.002–1.030)
UROBILINOGEN: NORMAL mg/dL

## 2022-07-06 LAB — COMPREHENSIVE METABOLIC PNL, FASTING
ALBUMIN/GLOBULIN RATIO: 1.8 — ABNORMAL HIGH (ref 0.8–1.4)
ALBUMIN: 4.8 g/dL (ref 3.5–5.7)
ALKALINE PHOSPHATASE: 37 U/L (ref 34–104)
ALT (SGPT): 13 U/L (ref 7–52)
ANION GAP: 6 mmol/L (ref 4–13)
AST (SGOT): 21 U/L (ref 13–39)
BILIRUBIN TOTAL: 0.7 mg/dL (ref 0.3–1.2)
BUN/CREA RATIO: 12 (ref 6–22)
BUN: 11 mg/dL (ref 7–25)
CALCIUM, CORRECTED: 9.1 mg/dL (ref 8.9–10.8)
CALCIUM: 9.7 mg/dL (ref 8.6–10.3)
CHLORIDE: 105 mmol/L (ref 98–107)
CO2 TOTAL: 27 mmol/L (ref 21–31)
CREATININE: 0.94 mg/dL (ref 0.60–1.30)
ESTIMATED GFR: 88 mL/min/{1.73_m2} (ref >59)
GLOBULIN: 2.7 — ABNORMAL LOW (ref 2.9–5.4)
GLUCOSE: 84 mg/dL (ref 74–109)
OSMOLALITY, CALCULATED: 274 mOsm/kg (ref 270–290)
POTASSIUM: 3.8 mmol/L (ref 3.5–5.1)
PROTEIN TOTAL: 7.5 g/dL (ref 6.4–8.9)
SODIUM: 138 mmol/L (ref 136–145)

## 2022-07-06 LAB — CBC WITH DIFF
BASOPHIL #: 0 10*3/uL (ref 0.00–0.10)
BASOPHIL %: 0 % (ref 0–1)
EOSINOPHIL #: 0 10*3/uL (ref 0.00–0.50)
EOSINOPHIL %: 0 % — ABNORMAL LOW
HCT: 42.6 % (ref 36.7–47.1)
HGB: 14.4 g/dL (ref 12.5–16.3)
LYMPHOCYTE #: 2.4 10*3/uL (ref 1.00–3.00)
LYMPHOCYTE %: 29 % (ref 16–44)
MCH: 29.2 pg (ref 23.8–33.4)
MCHC: 33.7 g/dL (ref 32.5–36.3)
MCV: 86.7 fL (ref 73.0–96.2)
MONOCYTE #: 0.8 10*3/uL (ref 0.30–1.00)
MONOCYTE %: 9 % (ref 5–13)
MPV: 9.9 fL (ref 7.4–11.4)
NEUTROPHIL #: 5.2 10*3/uL (ref 1.85–7.80)
NEUTROPHIL %: 61 % (ref 43–77)
PLATELETS: 231 10*3/uL (ref 140–440)
RBC: 4.91 10*6/uL (ref 4.06–5.63)
RDW: 13.5 % (ref 12.1–16.2)
WBC: 8.5 10*3/uL (ref 3.6–10.2)

## 2022-07-06 LAB — THYROID STIMULATING HORMONE WITH FREE T4 REFLEX: TSH: 0.491 u[IU]/mL (ref 0.450–5.330)

## 2022-07-06 LAB — LIPID PANEL
CHOL/HDL RATIO: 3.2
CHOLESTEROL: 158 mg/dL (ref <200)
HDL CHOL: 50 mg/dL (ref >=40)
LDL CALC: 92 mg/dL (ref 0–100)
TRIGLYCERIDES: 82 mg/dL (ref <=150)
VLDL CALC: 16 mg/dL (ref 0–50)

## 2022-07-06 LAB — URINALYSIS, MICROSCOPIC
NON-SQUAMOUS EPITHELIAL CELLS URINE: <1 /hpf (ref <=1)
WBCS: <1 /hpf (ref <6)

## 2022-07-06 LAB — MICROALBUMIN/CREATININE RATIO, URINE, RANDOM
CREATININE RANDOM URINE: 67 mg/dL (ref 30–125)
MICROALBUMIN RANDOM URINE: <0.7 mg/dL

## 2022-07-06 LAB — MAGNESIUM: MAGNESIUM: 1.9 mg/dL (ref 1.9–2.7)

## 2022-07-06 MED ORDER — HYDROCODONE 7.5 MG-ACETAMINOPHEN 325 MG TABLET
1.0000 | ORAL_TABLET | Freq: Three times a day (TID) | ORAL | 0 refills | Status: DC | PRN
Start: 2022-07-06 — End: 2022-08-07

## 2022-07-06 MED ORDER — LISINOPRIL 10 MG TABLET
10.0000 mg | ORAL_TABLET | Freq: Every day | ORAL | 1 refills | Status: DC
Start: 2022-07-06 — End: 2022-08-07

## 2022-07-06 MED ORDER — FLUOXETINE 10 MG CAPSULE
10.0000 mg | ORAL_CAPSULE | Freq: Every day | ORAL | 1 refills | Status: DC
Start: 2022-07-06 — End: 2022-08-07

## 2022-07-06 MED ORDER — SIMVASTATIN 40 MG TABLET
40.0000 mg | ORAL_TABLET | Freq: Every evening | ORAL | 1 refills | Status: DC
Start: 2022-07-06 — End: 2022-10-23

## 2022-07-06 NOTE — Nursing Note (Signed)
Patient is here for routine 1 month follow up for medication refills.

## 2022-07-06 NOTE — Telephone Encounter (Signed)
Patient is here for routine 1 month follow up for medication refills.

## 2022-07-06 NOTE — Progress Notes (Signed)
[facility]  [address]  [place] [address]    History and Physical     Name: Gregory Case MRN:  [id number]   Date: 07/06/2022 Age: 67 y.o.               Follow Up        Reason for Visit: Follow Up (Routine 1 month )  labs    History of Present Illness  Gregory Case is a 67 y.o. male who is being seen today in the office for follow-up concerning blood pressure/hypertension with reading today 126/84. Patient also monitors outpatient states readings are good. Overall he feels well denies any headaches dizziness chest pain. Continues to take prinivil 10 mg po daily.    F/u chol/lipids w pt on Zocor 40 mg q hs. Pt denies any SE or issues w chol med. He also continues to watch his diet. F/u labs needed.    F/u glucose w pt currently not on meds nor checking BS at home. Pt denies increased thirst urination. Labs needed to check BS.    F/U chronic anxiety/depresion w pt on prozac 10 mg daily as well as clonazepam 1mg  prn. Pt states he is doing well at this time and denies any panic attacks or increased anxiety/depression sx.     Follow-up chronic back pain/DJD/OA for which patient has been on long-term narcotic use for multiple years tolerated ;hx of TKR noted in past ; patient states he still has arthritic pains in the hands knees and sometimes between dosing of Norco he uses Duexis or motrin. States at this time back pain is stable  and no new issues to report and medication refills requested. No  abuse history noted and patient compliant with urine drug screen.    Colonoscopy up-to-date; will be due again in 2026    Flu shot up to date    PHQ Questionnaire         Past Medical History:   Diagnosis Date    Abnormal glucose level     Anxiety     Chronic back pain     COVID-19 vaccine series completed     Depression     Mixed hyperlipidemia     Osteoarthritis          Past Surgical History:   Procedure Laterality Date    COLONOSCOPY W/ POLYPECTOMY      HX BACK SURGERY      HX  CARPAL TUNNEL RELEASE      HX KNEE REPLACMENT Right     HX TONSILLECTOMY        Family Medical History:       Problem Relation (Age of Onset)    Coronary Artery Disease Mother    Diabetes type II Mother    Hypertension (High Blood Pressure) Other            Social History     Tobacco Use    Smoking status: Never    Smokeless tobacco: Never   Substance Use Topics    Alcohol use: Yes     Comment: few times a week    Drug use: Never     Medication:  clonazePAM (KLONOPIN) 1 mg Oral Tablet, Take 1 Tablet (1 mg total) by mouth Every night  HYDROcodone-acetaminophen (NORCO) 7.5-325 mg Oral Tablet, Take 1 Tablet by mouth Three times a day as needed for Pain for up to 30 days  omega 3-dha-epa-fish oil 300 mg (120 mg- 180mg )-1,000 mg Oral Capsule, Take  1 Capsule by mouth Once a day  FLUoxetine (PROZAC) 10 mg Oral Capsule, Take 1 Capsule (10 mg total) by mouth Once a day  lisinopriL (PRINIVIL) 10 mg Oral Tablet, Take 1 Tablet (10 mg total) by mouth Once a day  simvastatin (ZOCOR) 40 mg Oral Tablet, Take 1 Tablet (40 mg total) by mouth Every night    No facility-administered medications prior to visit.    Allergies:  Allergies   Allergen Reactions    Meloxicam Hives/ Urticaria       Physical Exam:  Vitals:    07/06/22 0944   BP: 126/84   Pulse: 67   Temp: 36.3 C (97.4 F)   TempSrc: Tympanic   SpO2: 97%   Height: 1.651 m (5\' 5" )        Physical Exam  Vitals and nursing note reviewed.   Constitutional:       General: He is not in acute distress.     Appearance: Normal appearance.   HENT:      Right Ear: Tympanic membrane normal.      Left Ear: Tympanic membrane normal.      Mouth/Throat:      Mouth: Mucous membranes are moist.      Pharynx: Oropharynx is clear.   Eyes:      Pupils: Pupils are equal, round, and reactive to light.   Cardiovascular:      Rate and Rhythm: Normal rate and regular rhythm.      Pulses: Normal pulses.      Heart sounds: Normal heart sounds.   Pulmonary:      Effort: Pulmonary effort is normal.       Breath sounds: Normal breath sounds.   Abdominal:      General: Bowel sounds are normal.      Palpations: Abdomen is soft.      Tenderness: There is no abdominal tenderness.   Musculoskeletal:         General: No swelling or tenderness. Normal range of motion.      Cervical back: Normal range of motion and neck supple.      Comments: Chronic lower back pain with forward flexion extension which is limited  Negative straight leg raise bilateral  Arthritic changes bilateral hands and knees   Skin:     General: Skin is warm.      Findings: No lesion or rash.   Neurological:      General: No focal deficit present.      Mental Status: He is alert and oriented to person, place, and time.   Psychiatric:         Mood and Affect: Mood normal.         Behavior: Behavior normal.         Thought Content: Thought content normal.         Judgment: Judgment normal.         Assessment/Plan:  Problem List Items Addressed This Visit       Abnormal glucose level    Relevant Orders    DRUG SCREEN, NO CONFIRMATION, URINE    THYROID STIMULATING HORMONE WITH FREE T4 REFLEX    CBC/DIFF    COMPREHENSIVE METABOLIC PNL, FASTING    HGA1C (HEMOGLOBIN A1C WITH EST AVG GLUCOSE)    URINALYSIS, MACROSCOPIC AND MICROSCOPIC W/CULTURE REFLEX    LIPID PANEL    MICROALBUMIN/CREATININE RATIO, URINE, RANDOM    MAGNESIUM    Mixed hyperlipidemia    Relevant Orders    DRUG SCREEN, NO  CONFIRMATION, URINE    THYROID STIMULATING HORMONE WITH FREE T4 REFLEX    CBC/DIFF    COMPREHENSIVE METABOLIC PNL, FASTING    HGA1C (HEMOGLOBIN A1C WITH EST AVG GLUCOSE)    URINALYSIS, MACROSCOPIC AND MICROSCOPIC W/CULTURE REFLEX    LIPID PANEL    MICROALBUMIN/CREATININE RATIO, URINE, RANDOM    MAGNESIUM    Anxiety    Chronic back pain    Essential hypertension - Primary    Relevant Orders    DRUG SCREEN, NO CONFIRMATION, URINE    THYROID STIMULATING HORMONE WITH FREE T4 REFLEX    CBC/DIFF    COMPREHENSIVE METABOLIC PNL, FASTING    HGA1C (HEMOGLOBIN A1C WITH EST AVG GLUCOSE)     URINALYSIS, MACROSCOPIC AND MICROSCOPIC W/CULTURE REFLEX    LIPID PANEL    MICROALBUMIN/CREATININE RATIO, URINE, RANDOM    MAGNESIUM    Depression     Other Visit Diagnoses       Long-term use of high-risk medication        Relevant Orders    DRUG SCREEN, NO CONFIRMATION, URINE    MICROALBUMIN/CREATININE RATIO, URINE, RANDOM              Labs obtained in office  Meds refilled as noted  Screening test up to date  F/u pending lab results otherwise 1 month    Post-Discharge Follow Up Appointments       Monday Aug 07, 2022    Return Patient Visit with Eyvonne Mechanic, PA-C at  8:30 AM      Tuesday Sep 05, 2022    Return Patient Visit with Eyvonne Mechanic, PA-C at  9:00 AM      Thursday Oct 05, 2022    Return Patient Visit with Eyvonne Mechanic, PA-C at  9:00 AM      Thursday Nov 02, 2022    Return Patient Visit with Eyvonne Mechanic, PA-C at  9:00 AM      Thursday Nov 30, 2022    Return Patient Visit with Eyvonne Mechanic, PA-C at  9:00 AM      Thursday May 31, 2023    Return Telephone Visit with Eyvonne Mechanic, PA-C at  1:00 PM      [facility], [facility]  [facility]  [address]  [place] [address]  [phone]               Seek medical attention for new or worsening symptoms.  Patient has been seen in this clinic within the last 3 years.     Arshan Jabs Hyman Bible, PA-C      I personally reviewed the documentation of care provided by the certified physician assistant and I agree with her medical decision making, Weyman Pedro, D.O.       This note was partially created using MModal Fluency Direct system (voice recognition software) and is inherently subject to errors including those of syntax and "sound-alike" substitutions which may escape proofreading.  In such instances, original meaning may be extrapolated by contextual derivation.  Physical Exam  Vitals and nursing note reviewed.   Constitutional:       General: He is not in acute distress.     Appearance: Normal appearance.   HENT:      Right Ear: Tympanic membrane normal.       Left Ear: Tympanic membrane normal.      Mouth/Throat:      Mouth: Mucous membranes are moist.      Pharynx: Oropharynx is clear.   Eyes:  Pupils: Pupils are equal, round, and reactive to light.   Cardiovascular:      Rate and Rhythm: Normal rate and regular rhythm.      Pulses: Normal pulses.      Heart sounds: Normal heart sounds.   Pulmonary:      Effort: Pulmonary effort is normal.      Breath sounds: Normal breath sounds.   Abdominal:      General: Bowel sounds are normal.      Palpations: Abdomen is soft.      Tenderness: There is no abdominal tenderness.   Musculoskeletal:         General: No swelling or tenderness. Normal range of motion.      Cervical back: Normal range of motion and neck supple.      Comments: Chronic lower back pain with forward flexion extension which is limited  Negative straight leg raise bilateral  Arthritic changes bilateral hands and knees   Skin:     General: Skin is warm.      Findings: No lesion or rash.   Neurological:      General: No focal deficit present.      Mental Status: He is alert and oriented to person, place, and time.   Psychiatric:         Mood and Affect: Mood normal.         Behavior: Behavior normal.         Thought Content: Thought content normal.         Judgment: Judgment normal.

## 2022-07-07 LAB — HGA1C (HEMOGLOBIN A1C WITH EST AVG GLUCOSE): HEMOGLOBIN A1C: 5.6 % (ref 4.0–6.0)

## 2022-07-27 ENCOUNTER — Ambulatory Visit (INDEPENDENT_AMBULATORY_CARE_PROVIDER_SITE_OTHER): Payer: Self-pay | Admitting: Physician Assistant

## 2022-08-07 ENCOUNTER — Other Ambulatory Visit (INDEPENDENT_AMBULATORY_CARE_PROVIDER_SITE_OTHER): Payer: Self-pay | Admitting: Physician Assistant

## 2022-08-07 ENCOUNTER — Encounter (INDEPENDENT_AMBULATORY_CARE_PROVIDER_SITE_OTHER): Payer: Self-pay | Admitting: Physician Assistant

## 2022-08-07 ENCOUNTER — Ambulatory Visit: Payer: Medicare HMO | Attending: Physician Assistant | Admitting: Physician Assistant

## 2022-08-07 ENCOUNTER — Other Ambulatory Visit: Payer: Self-pay

## 2022-08-07 DIAGNOSIS — G8929 Other chronic pain: Secondary | ICD-10-CM | POA: Insufficient documentation

## 2022-08-07 DIAGNOSIS — F32A Depression, unspecified: Secondary | ICD-10-CM | POA: Insufficient documentation

## 2022-08-07 DIAGNOSIS — M159 Polyosteoarthritis, unspecified: Secondary | ICD-10-CM | POA: Insufficient documentation

## 2022-08-07 DIAGNOSIS — F419 Anxiety disorder, unspecified: Secondary | ICD-10-CM | POA: Insufficient documentation

## 2022-08-07 DIAGNOSIS — I10 Essential (primary) hypertension: Secondary | ICD-10-CM | POA: Insufficient documentation

## 2022-08-07 DIAGNOSIS — M545 Low back pain, unspecified: Secondary | ICD-10-CM | POA: Insufficient documentation

## 2022-08-07 MED ORDER — FLUOXETINE 10 MG CAPSULE
10.0000 mg | ORAL_CAPSULE | Freq: Every day | ORAL | 1 refills | Status: DC
Start: 2022-08-07 — End: 2022-11-07

## 2022-08-07 MED ORDER — HYDROCODONE 7.5 MG-ACETAMINOPHEN 325 MG TABLET
1.0000 | ORAL_TABLET | Freq: Three times a day (TID) | ORAL | 0 refills | Status: DC | PRN
Start: 2022-08-07 — End: 2022-09-05

## 2022-08-07 MED ORDER — LISINOPRIL 10 MG TABLET
10.0000 mg | ORAL_TABLET | Freq: Every day | ORAL | 1 refills | Status: DC
Start: 2022-08-07 — End: 2022-12-11

## 2022-08-07 MED ORDER — CLONAZEPAM 1 MG TABLET
1.0000 mg | ORAL_TABLET | Freq: Every evening | ORAL | 2 refills | Status: DC
Start: 2022-08-07 — End: 2022-09-05

## 2022-08-07 NOTE — Progress Notes (Signed)
[facility]  [address]  [place] [address]    History and Physical     Name: Gregory Case MRN:  [id number]   Date: 08/07/2022 Age: 67 y.o.               Follow Up        Reason for Visit: Follow Up (Routine 57m CDM)     History of Present Illness  Gregory Case is a 67 y.o. male who is being seen today in the office for follow-up concerning blood pressure/hypertension with reading today 118/82. Patient also monitors outpatient states readings are good. Overall he feels well denies any headaches dizziness chest pain. Continues to take prinivil 10 mg po daily.    F/U chronic anxiety/depresion w pt on prozac 10 mg daily as well as clonazepam 1mg  prn. Pt states he is doing well at this time and denies any panic attacks or increased anxiety/depression sx.     Follow-up chronic back pain/DJD/OA for which patient has been on long-term narcotic use for multiple years tolerated ;hx of TKR noted in past ; patient states he still has arthritic pains in the hands knees and sometimes between dosing of Norco he uses Duexis or motrin. States at this time back pain is stable  and no new issues to report and medication refills requested. No  abuse history noted and patient compliant with urine drug screen.    Colonoscopy up-to-date; will be due again in 2026    Flu shot up to date    PHQ Questionnaire         Past Medical History:   Diagnosis Date    Abnormal glucose level     Anxiety     Chronic back pain     COVID-19 vaccine series completed     Depression     Mixed hyperlipidemia     Osteoarthritis          Past Surgical History:   Procedure Laterality Date    COLONOSCOPY W/ POLYPECTOMY      HX BACK SURGERY      HX CARPAL TUNNEL RELEASE      HX KNEE REPLACMENT Right     HX TONSILLECTOMY        Family Medical History:       Problem Relation (Age of Onset)    Coronary Artery Disease Mother    Diabetes type II Mother    Hypertension (High Blood Pressure) Other            Social History      Tobacco Use    Smoking status: Never    Smokeless tobacco: Never   Substance Use Topics    Alcohol use: Yes     Comment: few times a week    Drug use: Never     Medication:  HYDROcodone-acetaminophen (NORCO) 7.5-325 mg Oral Tablet, Take 1 Tablet by mouth Three times a day as needed for Pain for up to 30 days  omega 3-dha-epa-fish oil 300 mg (120 mg- 180mg )-1,000 mg Oral Capsule, Take 1 Capsule by mouth Once a day  simvastatin (ZOCOR) 40 mg Oral Tablet, Take 1 Tablet (40 mg total) by mouth Every night  clonazePAM (KLONOPIN) 1 mg Oral Tablet, Take 1 Tablet (1 mg total) by mouth Every night  FLUoxetine (PROZAC) 10 mg Oral Capsule, Take 1 Capsule (10 mg total) by mouth Once a day  lisinopriL (PRINIVIL) 10 mg Oral Tablet, Take 1 Tablet (10 mg total) by mouth Once a day  No facility-administered medications prior to visit.    Allergies:  Allergies   Allergen Reactions    Meloxicam Hives/ Urticaria       Physical Exam:  There were no vitals filed for this visit.       Physical Exam  Vitals and nursing note reviewed.   Constitutional:       General: He is not in acute distress.     Appearance: Normal appearance.   HENT:      Right Ear: Tympanic membrane normal.      Left Ear: Tympanic membrane normal.      Mouth/Throat:      Mouth: Mucous membranes are moist.      Pharynx: Oropharynx is clear.   Eyes:      Pupils: Pupils are equal, round, and reactive to light.   Cardiovascular:      Rate and Rhythm: Normal rate and regular rhythm.      Pulses: Normal pulses.      Heart sounds: Normal heart sounds.   Pulmonary:      Effort: Pulmonary effort is normal.      Breath sounds: Normal breath sounds.   Abdominal:      General: Bowel sounds are normal.      Palpations: Abdomen is soft.      Tenderness: There is no abdominal tenderness.   Musculoskeletal:         General: No swelling or tenderness. Normal range of motion.      Cervical back: Normal range of motion and neck supple.      Comments: Chronic lower back pain with  forward flexion extension which is limited  Negative straight leg raise bilateral  Arthritic changes bilateral hands and knees   Skin:     General: Skin is warm.      Findings: No lesion or rash.   Neurological:      General: No focal deficit present.      Mental Status: He is alert and oriented to person, place, and time.   Psychiatric:         Mood and Affect: Mood normal.         Behavior: Behavior normal.         Thought Content: Thought content normal.         Judgment: Judgment normal.         Assessment/Plan:  Problem List Items Addressed This Visit       Osteoarthritis    Anxiety    Chronic back pain    Essential hypertension - Primary    Depression         Labs up to date  Meds refilled as noted  Screening test up to date  F/u 1 month    Post-Discharge Follow Up Appointments       Tuesday Sep 05, 2022    Return Patient Visit with Eyvonne Mechanic, PA-C at  9:00 AM      Thursday Oct 05, 2022    Return Patient Visit with Eyvonne Mechanic, PA-C at  9:00 AM      Thursday Nov 02, 2022    Return Patient Visit with Eyvonne Mechanic, PA-C at  9:00 AM      Thursday Nov 30, 2022    Return Patient Visit with Eyvonne Mechanic, PA-C at  9:00 AM      Thursday May 31, 2023    Return Telephone Visit with Hyman Bible, Stanton Kissoon, PA-C at  1:00 PM      [facility] Medicine, [facility]  [facility]  [address]  [place] [address]  [phone]               Seek medical attention for new or worsening symptoms.  Patient has been seen in this clinic within the last 3 years.     Taytum Scheck Hyman Bible, PA-C      I personally reviewed the documentation of care provided by the certified physician assistant and I agree with her medical decision making, Weyman Pedro, D.O.       This note was partially created using MModal Fluency Direct system (voice recognition software) and is inherently subject to errors including those of syntax and "sound-alike" substitutions which may escape proofreading.  In such instances, original meaning may be extrapolated by  contextual derivation.  Physical Exam  Vitals and nursing note reviewed.   Constitutional:       General: He is not in acute distress.     Appearance: Normal appearance.   HENT:      Right Ear: Tympanic membrane normal.      Left Ear: Tympanic membrane normal.      Mouth/Throat:      Mouth: Mucous membranes are moist.      Pharynx: Oropharynx is clear.   Eyes:      Pupils: Pupils are equal, round, and reactive to light.   Cardiovascular:      Rate and Rhythm: Normal rate and regular rhythm.      Pulses: Normal pulses.      Heart sounds: Normal heart sounds.   Pulmonary:      Effort: Pulmonary effort is normal.      Breath sounds: Normal breath sounds.   Abdominal:      General: Bowel sounds are normal.      Palpations: Abdomen is soft.      Tenderness: There is no abdominal tenderness.   Musculoskeletal:         General: No swelling or tenderness. Normal range of motion.      Cervical back: Normal range of motion and neck supple.      Comments: Chronic lower back pain with forward flexion extension which is limited  Negative straight leg raise bilateral  Arthritic changes bilateral hands and knees   Skin:     General: Skin is warm.      Findings: No lesion or rash.   Neurological:      General: No focal deficit present.      Mental Status: He is alert and oriented to person, place, and time.   Psychiatric:         Mood and Affect: Mood normal.         Behavior: Behavior normal.         Thought Content: Thought content normal.         Judgment: Judgment normal.

## 2022-08-07 NOTE — Nursing Note (Signed)
Patient is here for routine 36m CDM

## 2022-08-07 NOTE — Telephone Encounter (Signed)
Patient is here for routine 51m CDM for med refills

## 2022-09-05 ENCOUNTER — Other Ambulatory Visit: Payer: Self-pay

## 2022-09-05 ENCOUNTER — Encounter (INDEPENDENT_AMBULATORY_CARE_PROVIDER_SITE_OTHER): Payer: Self-pay | Admitting: Physician Assistant

## 2022-09-05 ENCOUNTER — Other Ambulatory Visit (INDEPENDENT_AMBULATORY_CARE_PROVIDER_SITE_OTHER): Payer: Self-pay | Admitting: Physician Assistant

## 2022-09-05 ENCOUNTER — Ambulatory Visit: Payer: Medicare PPO | Attending: Physician Assistant | Admitting: Physician Assistant

## 2022-09-05 VITALS — BP 120/78 | HR 67 | Ht 65.0 in | Wt 177.0 lb

## 2022-09-05 DIAGNOSIS — G8929 Other chronic pain: Secondary | ICD-10-CM | POA: Insufficient documentation

## 2022-09-05 DIAGNOSIS — I10 Essential (primary) hypertension: Secondary | ICD-10-CM | POA: Insufficient documentation

## 2022-09-05 DIAGNOSIS — F32A Depression, unspecified: Secondary | ICD-10-CM | POA: Insufficient documentation

## 2022-09-05 DIAGNOSIS — M159 Polyosteoarthritis, unspecified: Secondary | ICD-10-CM | POA: Insufficient documentation

## 2022-09-05 DIAGNOSIS — F419 Anxiety disorder, unspecified: Secondary | ICD-10-CM | POA: Insufficient documentation

## 2022-09-05 DIAGNOSIS — M545 Low back pain, unspecified: Secondary | ICD-10-CM | POA: Insufficient documentation

## 2022-09-05 MED ORDER — HYDROCODONE 7.5 MG-ACETAMINOPHEN 325 MG TABLET
1.0000 | ORAL_TABLET | Freq: Three times a day (TID) | ORAL | 0 refills | Status: DC | PRN
Start: 2022-09-05 — End: 2022-10-09

## 2022-09-05 MED ORDER — CLONAZEPAM 1 MG TABLET
1.0000 mg | ORAL_TABLET | Freq: Every evening | ORAL | 2 refills | Status: DC
Start: 2022-09-05 — End: 2022-11-20

## 2022-09-05 NOTE — Nursing Note (Signed)
Patient is here for routine 1 month follow up for medication refills.

## 2022-09-05 NOTE — Progress Notes (Signed)
[facility]  [address]  [place] [address]    History and Physical     Name: Gregory Case MRN:  [id number]   Date: 09/05/2022 Age: 67 y.o.               Follow Up        Reason for Visit: Follow Up (Routine )     History of Present Illness  Fernanda Chorley is a 67 y.o. male who is being seen today in the office for follow-up concerning blood pressure/hypertension with reading today 120/78 .Patient also monitors outpatient states readings are good. Overall he feels well denies any headaches dizziness chest pain. Continues to take prinivil 10 mg po daily.    F/U chronic anxiety/depresion w pt on prozac 10 mg daily as well as clonazepam 1mg  prn. Pt states he is doing well at this time and denies any panic attacks or increased anxiety/depression sx.     Follow-up chronic back pain/DJD/OA for which patient has been on long-term narcotic use for multiple years tolerated ;hx of TKR noted in past ; patient states he still has arthritic pains in the hands knees and sometimes between dosing of Norco he uses Duexis or motrin. States at this time back pain is stable  and no new issues to report and medication refills requested. No  abuse history noted and patient compliant with urine drug screen.    Colonoscopy up-to-date; will be due again in 2026        PHQ Questionnaire         Past Medical History:   Diagnosis Date    Abnormal glucose level     Anxiety     Chronic back pain     COVID-19 vaccine series completed     Depression     Mixed hyperlipidemia     Osteoarthritis          Past Surgical History:   Procedure Laterality Date    COLONOSCOPY W/ POLYPECTOMY      HX BACK SURGERY      HX CARPAL TUNNEL RELEASE      HX KNEE REPLACMENT Right     HX TONSILLECTOMY        Family Medical History:       Problem Relation (Age of Onset)    Coronary Artery Disease Mother    Diabetes type II Mother    Hypertension (High Blood Pressure) Other            Social History     Tobacco Use    Smoking  status: Never    Smokeless tobacco: Never   Substance Use Topics    Alcohol use: Yes     Comment: few times a week    Drug use: Never     Medication:  FLUoxetine (PROZAC) 10 mg Oral Capsule, Take 1 Capsule (10 mg total) by mouth Once a day  HYDROcodone-acetaminophen (NORCO) 7.5-325 mg Oral Tablet, Take 1 Tablet by mouth Three times a day as needed for Pain for up to 30 days  lisinopriL (PRINIVIL) 10 mg Oral Tablet, Take 1 Tablet (10 mg total) by mouth Once a day  omega 3-dha-epa-fish oil 300 mg (120 mg- 180mg )-1,000 mg Oral Capsule, Take 1 Capsule by mouth Once a day  simvastatin (ZOCOR) 40 mg Oral Tablet, Take 1 Tablet (40 mg total) by mouth Every night  clonazePAM (KLONOPIN) 1 mg Oral Tablet, Take 1 Tablet (1 mg total) by mouth Every night    No facility-administered  medications prior to visit.    Allergies:  Allergies   Allergen Reactions    Meloxicam Hives/ Urticaria       Physical Exam:  Vitals:    09/05/22 0911   BP: 120/78   Pulse: 67   SpO2: 95%   Weight: 80.3 kg (177 lb)   Height: 1.651 m (5\' 5" )   BMI: 29.52        Physical Exam  Vitals and nursing note reviewed.   Constitutional:       General: He is not in acute distress.     Appearance: Normal appearance.   HENT:      Right Ear: Tympanic membrane normal.      Left Ear: Tympanic membrane normal.      Mouth/Throat:      Mouth: Mucous membranes are moist.      Pharynx: Oropharynx is clear.   Eyes:      Pupils: Pupils are equal, round, and reactive to light.   Cardiovascular:      Rate and Rhythm: Normal rate and regular rhythm.      Pulses: Normal pulses.      Heart sounds: Normal heart sounds.   Pulmonary:      Effort: Pulmonary effort is normal.      Breath sounds: Normal breath sounds.   Abdominal:      General: Bowel sounds are normal.      Palpations: Abdomen is soft.      Tenderness: There is no abdominal tenderness.   Musculoskeletal:         General: No swelling or tenderness. Normal range of motion.      Cervical back: Normal range of motion and  neck supple.      Comments: Chronic lower back pain with forward flexion extension which is limited  Negative straight leg raise bilateral  Arthritic changes bilateral hands and knees   Skin:     General: Skin is warm.      Findings: No lesion or rash.   Neurological:      General: No focal deficit present.      Mental Status: He is alert and oriented to person, place, and time.   Psychiatric:         Mood and Affect: Mood normal.         Behavior: Behavior normal.         Thought Content: Thought content normal.         Judgment: Judgment normal.         Assessment/Plan:  Problem List Items Addressed This Visit       Osteoarthritis    Anxiety    Chronic back pain    Essential hypertension - Primary    Depression         Labs up to date  Meds refilled as noted  Screening test up to date      Post-Discharge Follow Up Appointments       Thursday Oct 05, 2022    Return Patient Visit with Eyvonne Mechanic, PA-C at  9:00 AM      Thursday Nov 02, 2022    Return Patient Visit with Eyvonne Mechanic, PA-C at  9:00 AM      Thursday Nov 30, 2022    Return Patient Visit with Eyvonne Mechanic, PA-C at  9:00 AM      Thursday May 31, 2023    Return Telephone Visit with Hyman Bible, Jovane Foutz, PA-C at  1:00 PM      Internal  Medicine, Building [facility]  [facility]  [address]  [place] [address]  [phone]               Seek medical attention for new or worsening symptoms.  Patient has been seen in this clinic within the last 3 years.     Jeily Guthridge, PA-C          This note was partially created using MModal Fluency Direct system (voice recognition software) and is inherently subject to errors including those of syntax and "sound-alike" substitutions which may escape proofreading.  In such instances, original meaning may be extrapolated by contextual derivation.  Physical Exam  Vitals and nursing note reviewed.   Constitutional:       General: He is not in acute distress.     Appearance: Normal appearance.   HENT:      Right Ear: Tympanic  membrane normal.      Left Ear: Tympanic membrane normal.      Mouth/Throat:      Mouth: Mucous membranes are moist.      Pharynx: Oropharynx is clear.   Eyes:      Pupils: Pupils are equal, round, and reactive to light.   Cardiovascular:      Rate and Rhythm: Normal rate and regular rhythm.      Pulses: Normal pulses.      Heart sounds: Normal heart sounds.   Pulmonary:      Effort: Pulmonary effort is normal.      Breath sounds: Normal breath sounds.   Abdominal:      General: Bowel sounds are normal.      Palpations: Abdomen is soft.      Tenderness: There is no abdominal tenderness.   Musculoskeletal:         General: No swelling or tenderness. Normal range of motion.      Cervical back: Normal range of motion and neck supple.      Comments: Chronic lower back pain with forward flexion extension which is limited  Negative straight leg raise bilateral  Arthritic changes bilateral hands and knees   Skin:     General: Skin is warm.      Findings: No lesion or rash.   Neurological:      General: No focal deficit present.      Mental Status: He is alert and oriented to person, place, and time.   Psychiatric:         Mood and Affect: Mood normal.         Behavior: Behavior normal.         Thought Content: Thought content normal.         Judgment: Judgment normal.

## 2022-10-05 ENCOUNTER — Ambulatory Visit (INDEPENDENT_AMBULATORY_CARE_PROVIDER_SITE_OTHER): Payer: Self-pay | Admitting: Physician Assistant

## 2022-10-09 ENCOUNTER — Encounter (INDEPENDENT_AMBULATORY_CARE_PROVIDER_SITE_OTHER): Payer: Self-pay | Admitting: Physician Assistant

## 2022-10-09 ENCOUNTER — Ambulatory Visit: Payer: Medicare PPO | Attending: Physician Assistant | Admitting: Physician Assistant

## 2022-10-09 ENCOUNTER — Other Ambulatory Visit (INDEPENDENT_AMBULATORY_CARE_PROVIDER_SITE_OTHER): Payer: Self-pay | Admitting: Physician Assistant

## 2022-10-09 ENCOUNTER — Other Ambulatory Visit: Payer: Self-pay

## 2022-10-09 VITALS — BP 142/82 | HR 78 | Ht 65.0 in | Wt 176.0 lb

## 2022-10-09 DIAGNOSIS — G8929 Other chronic pain: Secondary | ICD-10-CM | POA: Insufficient documentation

## 2022-10-09 DIAGNOSIS — M545 Low back pain, unspecified: Secondary | ICD-10-CM | POA: Insufficient documentation

## 2022-10-09 DIAGNOSIS — I10 Essential (primary) hypertension: Secondary | ICD-10-CM | POA: Insufficient documentation

## 2022-10-09 DIAGNOSIS — F419 Anxiety disorder, unspecified: Secondary | ICD-10-CM | POA: Insufficient documentation

## 2022-10-09 DIAGNOSIS — F32A Depression, unspecified: Secondary | ICD-10-CM | POA: Insufficient documentation

## 2022-10-09 MED ORDER — TIZANIDINE 4 MG TABLET
4.0000 mg | ORAL_TABLET | Freq: Three times a day (TID) | ORAL | 0 refills | Status: DC | PRN
Start: 2022-10-09 — End: 2022-11-07

## 2022-10-09 MED ORDER — HYDROCODONE 7.5 MG-ACETAMINOPHEN 325 MG TABLET
1.0000 | ORAL_TABLET | Freq: Three times a day (TID) | ORAL | 0 refills | Status: DC | PRN
Start: 2022-10-09 — End: 2022-11-07

## 2022-10-09 NOTE — Telephone Encounter (Signed)
Patient is here for routine 1 month follow up for medication refills.

## 2022-10-09 NOTE — Nursing Note (Signed)
Patient is here for routine 1 month follow up for medication refills.

## 2022-10-09 NOTE — Progress Notes (Signed)
[facility], [facility]  [address]  [place] [address]    History and Physical     Name: Gregory Case MRN:  [id number]   Date: 10/09/2022 Age: 67 y.o.               Follow Up        Reason for Visit: Follow Up (Routine 1 month )     History of Present Illness  Gregory Case is a 67 y.o. male who is being seen today in the office for follow-up concerning blood pressure/hypertension with reading today little higher then usual at 142/82. Last visit BP was 120/78. Patient once again just came back from the beach so ate a lot of seafood and food he should not of. Overall he feels well denies any headaches dizziness chest pain. Continues to take prinivil 10 mg po daily.    F/U chronic anxiety/depresion w pt on prozac 10 mg daily as well as clonazepam 1mg  prn. Pt states he is doing well at this time and denies any panic attacks or increased anxiety/depression sx.     Follow-up chronic back pain/DJD/OA for which patient has been on long-term narcotic use for multiple years tolerated ;hx of TKR noted in past ; patient states he still has arthritic pains in the hands knees and sometimes between dosing of Norco as well as motrin prn.  Pt states that they just came back from the beach and his back was bothering him a little more there so his sister gave him one of her Zanaflex which really helped. Would like a RX of his own. Needs his medication refills today. No  abuse history noted and patient compliant with urine drug screen. Pt is not sure when his narcan was last refilled so needs a new rx.    Colonoscopy up-to-date; will be due again in 2026        PHQ Questionnaire         Past Medical History:   Diagnosis Date    Abnormal glucose level     Anxiety     Chronic back pain     COVID-19 vaccine series completed     Depression     Mixed hyperlipidemia     Osteoarthritis          Past Surgical History:   Procedure Laterality Date    COLONOSCOPY W/ POLYPECTOMY      HX BACK SURGERY      HX CARPAL  TUNNEL RELEASE      HX KNEE REPLACMENT Right     HX TONSILLECTOMY        Family Medical History:       Problem Relation (Age of Onset)    Coronary Artery Disease Mother    Diabetes type II Mother    Hypertension (High Blood Pressure) Other            Social History     Tobacco Use    Smoking status: Never    Smokeless tobacco: Never   Substance Use Topics    Alcohol use: Yes     Comment: few times a week    Drug use: Never     Medication:  clonazePAM (KLONOPIN) 1 mg Oral Tablet, Take 1 Tablet (1 mg total) by mouth Every night  FLUoxetine (PROZAC) 10 mg Oral Capsule, Take 1 Capsule (10 mg total) by mouth Once a day  lisinopriL (PRINIVIL) 10 mg Oral Tablet, Take 1 Tablet (10 mg total) by mouth Once a day  omega 3-dha-epa-fish oil 300 mg (120 mg- 180mg )-1,000 mg Oral Capsule, Take 1 Capsule by mouth Once a day  simvastatin (ZOCOR) 40 mg Oral Tablet, Take 1 Tablet (40 mg total) by mouth Every night  HYDROcodone-acetaminophen (NORCO) 7.5-325 mg Oral Tablet, Take 1 Tablet by mouth Three times a day as needed for Pain for up to 30 days    No facility-administered medications prior to visit.    Allergies:  Allergies   Allergen Reactions    Meloxicam Hives/ Urticaria       Physical Exam:  Vitals:    10/09/22 0802   BP: (!) 142/82   Pulse: 78   SpO2: 98%   Weight: 79.8 kg (176 lb)   Height: 1.651 m (5\' 5" )   BMI: 29.35        Physical Exam  Vitals and nursing note reviewed.   Constitutional:       General: He is not in acute distress.     Appearance: Normal appearance.   HENT:      Right Ear: Tympanic membrane normal.      Left Ear: Tympanic membrane normal.      Mouth/Throat:      Mouth: Mucous membranes are moist.      Pharynx: Oropharynx is clear.   Eyes:      Pupils: Pupils are equal, round, and reactive to light.   Cardiovascular:      Rate and Rhythm: Normal rate and regular rhythm.      Pulses: Normal pulses.      Heart sounds: Normal heart sounds.   Pulmonary:      Effort: Pulmonary effort is normal.      Breath  sounds: Normal breath sounds.   Abdominal:      General: Bowel sounds are normal.      Palpations: Abdomen is soft.      Tenderness: There is no abdominal tenderness.   Musculoskeletal:         General: No swelling or tenderness. Normal range of motion.      Cervical back: Normal range of motion and neck supple.      Comments: Chronic lower back pain with forward flexion extension which is limited  Negative straight leg raise bilateral  Arthritic changes bilateral hands and knees   Skin:     General: Skin is warm.      Findings: No lesion or rash.   Neurological:      General: No focal deficit present.      Mental Status: He is alert and oriented to person, place, and time.   Psychiatric:         Mood and Affect: Mood normal.         Behavior: Behavior normal.         Thought Content: Thought content normal.         Judgment: Judgment normal.         Assessment/Plan:  Problem List Items Addressed This Visit       Anxiety    Chronic back pain    Essential hypertension - Primary    Depression           Labs up to date  To keep eye on BP at home  Decrease NA in diet & increase H20   Meds refilled as noted  Screening test up to date      Post-Discharge Follow Up Appointments       Tuesday Nov 07, 2022    Return Patient Visit with  Eyvonne Mechanic, PA-C at  9:00 AM      Thursday Dec 07, 2022    Return Patient Visit with Eyvonne Mechanic, PA-C at  9:00 AM      Thursday May 31, 2023    Return Telephone Visit with Eyvonne Mechanic, PA-C at  1:00 PM      Internal Medicine, [facility]  [facility]  [address]  [place] [address]  [phone]               Seek medical attention for new or worsening symptoms.  Patient has been seen in this clinic within the last 3 years.     Amy [place] Bible, PA-C        I personally reviewed the documentation of care provided by the certified physician assistant and I agree with her medical decision making, [place] Pedro, D.O.     This note was partially created using MModal Fluency Direct  system (voice recognition software) and is inherently subject to errors including those of syntax and "sound-alike" substitutions which may escape proofreading.  In such instances, original meaning may be extrapolated by contextual derivation.  Physical Exam  Vitals and nursing note reviewed.   Constitutional:       General: He is not in acute distress.     Appearance: Normal appearance.   HENT:      Right Ear: Tympanic membrane normal.      Left Ear: Tympanic membrane normal.      Mouth/Throat:      Mouth: Mucous membranes are moist.      Pharynx: Oropharynx is clear.   Eyes:      Pupils: Pupils are equal, round, and reactive to light.   Cardiovascular:      Rate and Rhythm: Normal rate and regular rhythm.      Pulses: Normal pulses.      Heart sounds: Normal heart sounds.   Pulmonary:      Effort: Pulmonary effort is normal.      Breath sounds: Normal breath sounds.   Abdominal:      General: Bowel sounds are normal.      Palpations: Abdomen is soft.      Tenderness: There is no abdominal tenderness.   Musculoskeletal:         General: No swelling or tenderness. Normal range of motion.      Cervical back: Normal range of motion and neck supple.      Comments: Chronic lower back pain with forward flexion extension which is limited  Negative straight leg raise bilateral  Arthritic changes bilateral hands and knees   Skin:     General: Skin is warm.      Findings: No lesion or rash.   Neurological:      General: No focal deficit present.      Mental Status: He is alert and oriented to person, place, and time.   Psychiatric:         Mood and Affect: Mood normal.         Behavior: Behavior normal.         Thought Content: Thought content normal.         Judgment: Judgment normal.

## 2022-10-20 IMAGING — MR MRI LUMBAR SPINE WITHOUT CONTRAST
4 of 6 series · 29 of 48 positions shown · IV contrast (gadolinium)
Comparison: None available.

﻿EXAM:  92490   MRI LUMBAR SPINE WITHOUT CONTRAST
INDICATION: 67-year-old with persistent low back pain. Sacroiliitis.  No past history of malignancy or back surgery.
TECHNIQUE: Multiplanar, multisequential MRI of the lumbosacral spine was performed without gadolinium contrast.

[Series 5: T2 · sagittal · 4.0mm · 0.94mm/px · 7 of 13 slices shown (1 of 3)]
[im 1/13]
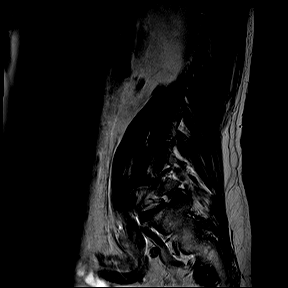
[im 3/13]
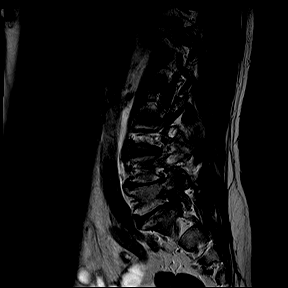
[im 5/13]
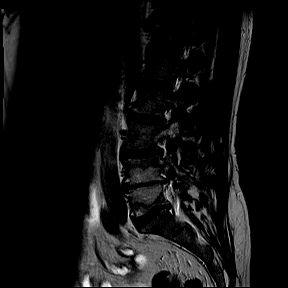
[im 7/13]
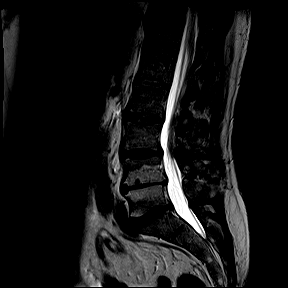
[im 9/13]
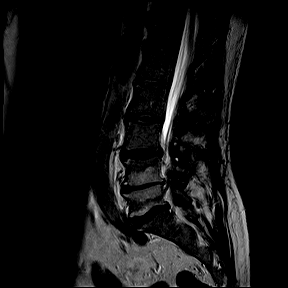
[im 11/13]
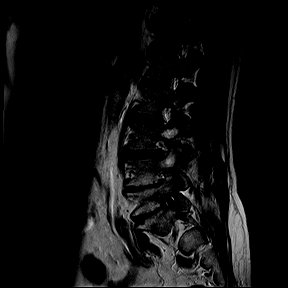
[im 13/13]
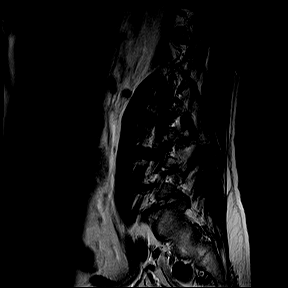

[Series 6: T1 · sagittal · 4.0mm · 0.94mm/px · 4 of 13 slices shown]
[im 1/13]
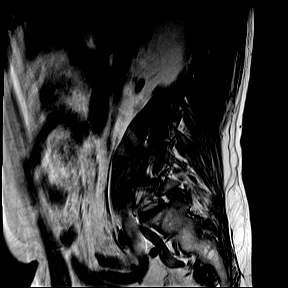
[im 3/13]
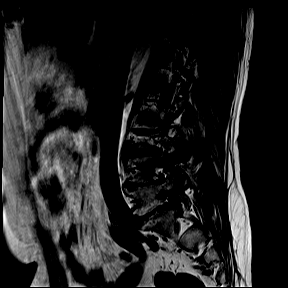
[im 7/13]
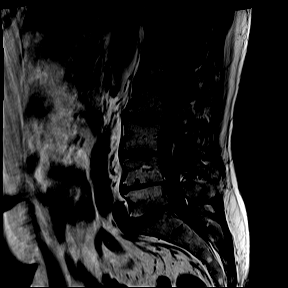
[im 11/13]
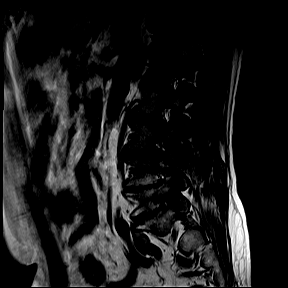

[Series 8: T2 · coronal · 5.0mm · 0.82mm/px · 9 of 18 slices shown (2 of 3)]
[im 1/18]
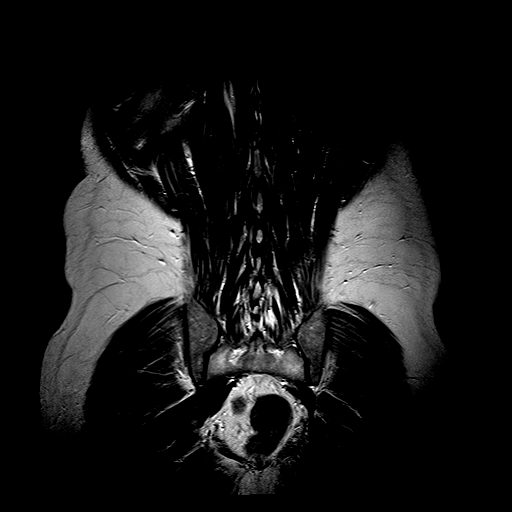
[im 3/18]
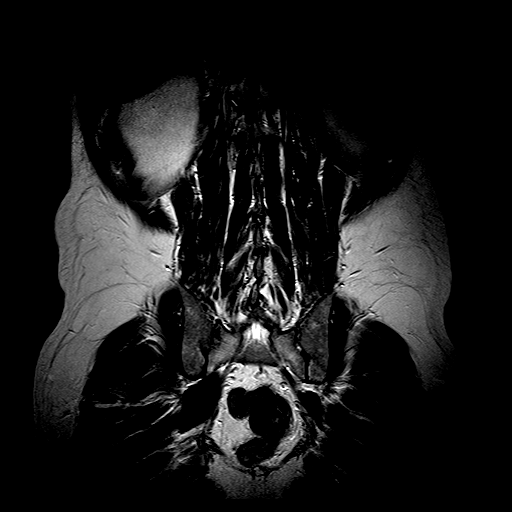
[im 5/18]
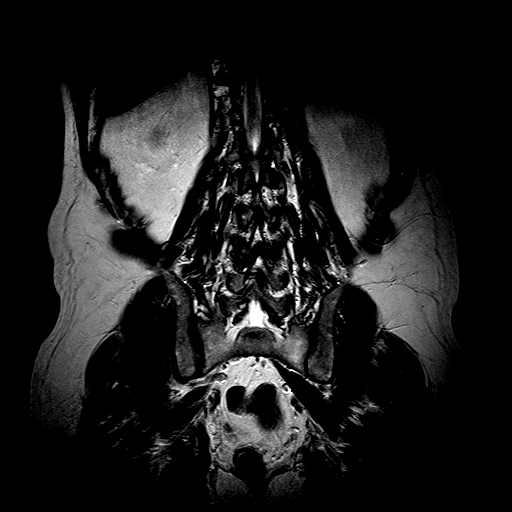
[im 7/18]
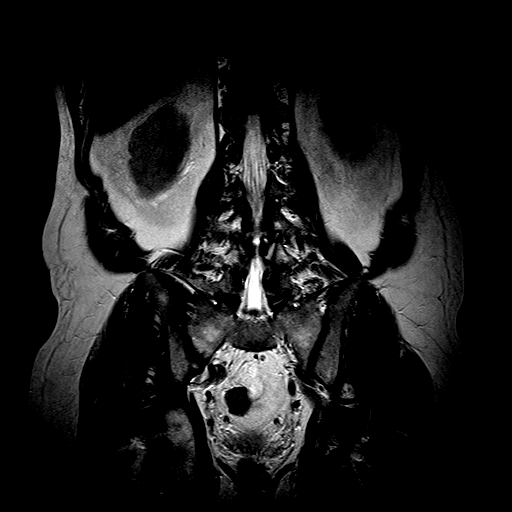
[im 9/18]
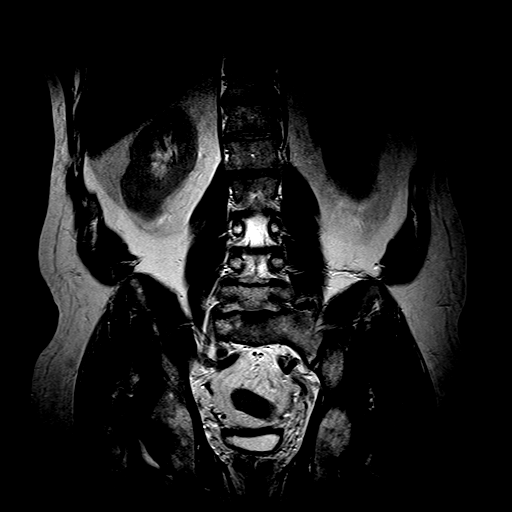
[im 11/18]
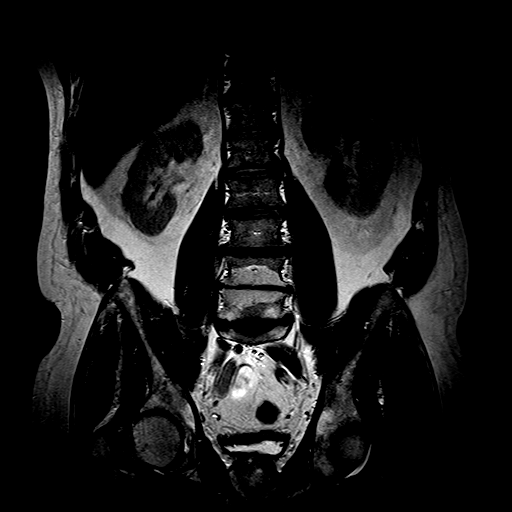
[im 13/18]
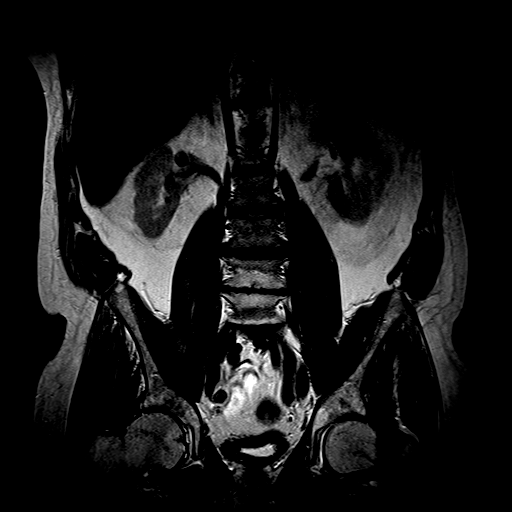
[im 15/18]
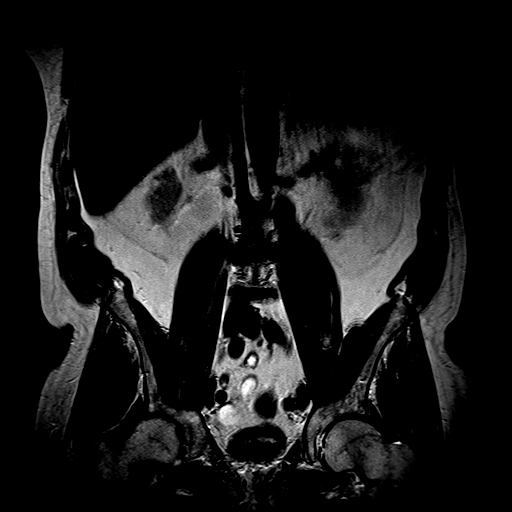
[im 18/18]
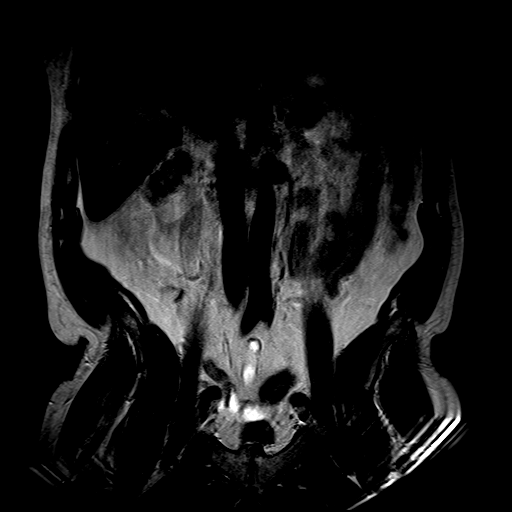

[Series 9: T2 · axial · 4.0mm · 0.52mm/px · z∈[-62,+174]mm · 9 of 17 slices shown (3 of 3)]
[im 1/17]
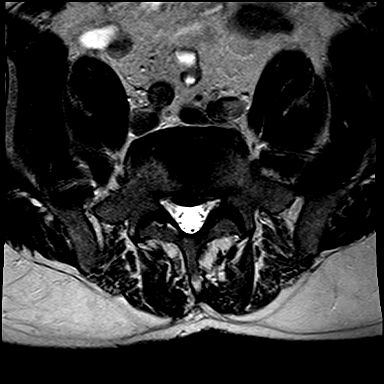
[im 3/17]
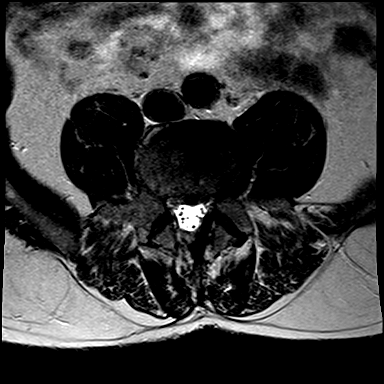
[im 5/17]
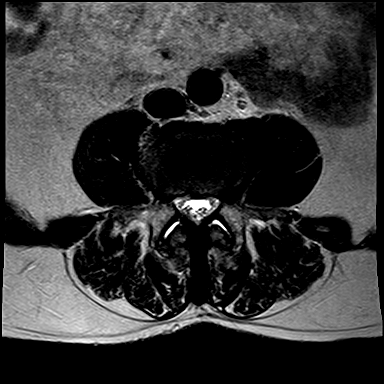
[im 7/17]
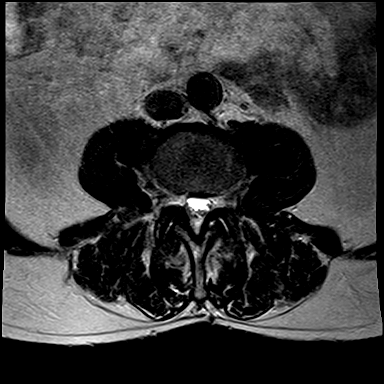
[im 9/17]
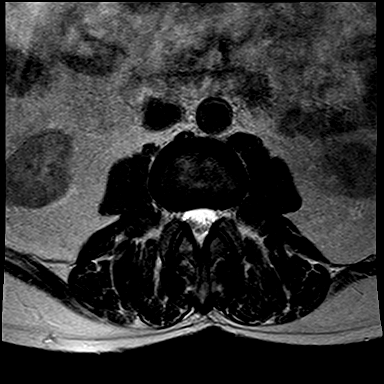
[im 11/17]
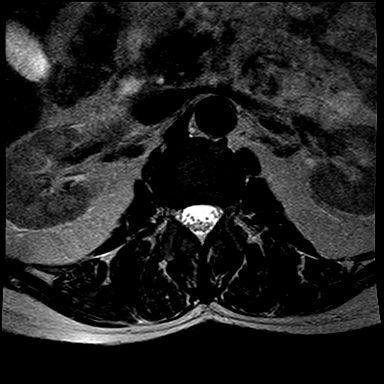
[im 13/17]
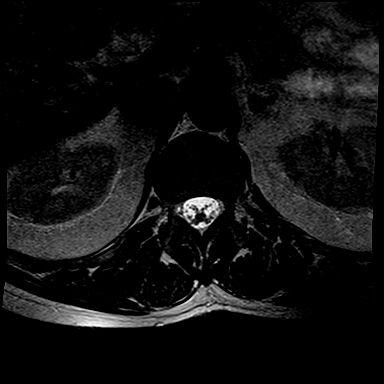
[im 15/17]
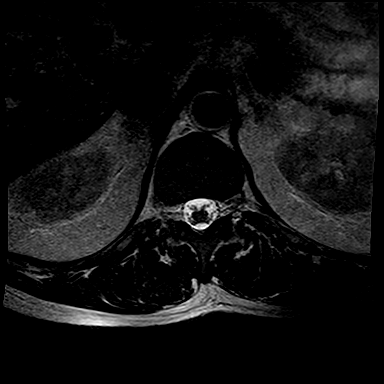
[im 17/17]
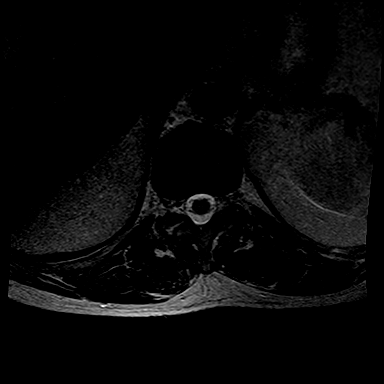

[29 of 48 positions shown; findings below may reference images not displayed]

FINDINGS: Modic type 2 inflammatory changes on both sides of L4-5 disc are noted. 

Conus terminates at L1 level. 

At L1-2 level, no focal disc lesions are seen. 

At L2-3 level, mild degenerative disc changes and facet arthropathy with minimal compromise of both lateral recess.

At L3-4 level, significant bilateral facet arthropathy with hypertrophy of dorsal ligaments and degenerative disc disease with bulging annulus are noted. Minimal degenerative anterolisthesis of L3 on L4.  There is significant compromise of both lateral recess and neural foramina at this level due to these changes, right worse than the left. Significant compromise of thecal sac with AP diameter in the midline measuring 8.5 mm.

At L4-5 level, severe loss of disc space with degenerative disc disease and bulging annulus is noted.  Suspicion of right-sided partial laminectomy at this level although patient provides no history of prior surgery.  The bulging annulus and facet arthropathy are causing severe biforaminal narrowing at this level impinging on L4 nerve roots on both sides, right worse than the left.

At L5-S1 level, significant degenerative disc disease with bulging annulus and facet arthropathy are noted. Minimal retrolisthesis of L5 on S1.  Severe biforaminal stenosis is noted at this level due to these changes.

Paravertebral soft tissues are unremarkable.  Mild bilateral sacroiliac arthritis is noted, right more than the left.
IMPRESSION: 1. Significant Modic type 2 inflammatory changes at L4-5 level. 

2.  At L3-4 level, significant bilateral facet arthropathy with hypertrophy of dorsal ligaments and degenerative disc disease with bulging annulus are noted. Minimal degenerative anterolisthesis of L3 on L4.  There is significant compromise of both lateral recess and neural foramina at this level due to these changes, right worse than the left. Significant compromise of thecal sac with AP diameter in the midline measuring 8.5 mm.

3. At L4-5 level, severe loss of disc space with degenerative disc disease and bulging annulus is noted.  Suspicion of right-sided partial laminectomy at this level although patient provides no history of prior surgery.  The bulging annulus and facet arthropathy are causing severe biforaminal narrowing at this level impinging on L4 nerve roots on both sides, right worse than the left.  Please correlate with surgical history of the patient.

4. At L5-S1 level, significant degenerative disc disease with bulging annulus and facet arthropathy are noted. Minimal retrolisthesis of L5 on S1.  Severe biforaminal stenosis is noted at this level due to these changes.

5. Sacroiliac arthritis, right more than the left.

## 2022-10-23 ENCOUNTER — Other Ambulatory Visit (INDEPENDENT_AMBULATORY_CARE_PROVIDER_SITE_OTHER): Payer: Self-pay | Admitting: Physician Assistant

## 2022-10-23 MED ORDER — SIMVASTATIN 40 MG TABLET
40.0000 mg | ORAL_TABLET | Freq: Every evening | ORAL | 1 refills | Status: DC
Start: 2022-10-23 — End: 2022-11-07

## 2022-11-02 ENCOUNTER — Ambulatory Visit (INDEPENDENT_AMBULATORY_CARE_PROVIDER_SITE_OTHER): Payer: Self-pay | Admitting: Physician Assistant

## 2022-11-07 ENCOUNTER — Other Ambulatory Visit: Payer: Self-pay

## 2022-11-07 ENCOUNTER — Other Ambulatory Visit (INDEPENDENT_AMBULATORY_CARE_PROVIDER_SITE_OTHER): Payer: Self-pay | Admitting: Physician Assistant

## 2022-11-07 ENCOUNTER — Encounter (INDEPENDENT_AMBULATORY_CARE_PROVIDER_SITE_OTHER): Payer: Self-pay | Admitting: Physician Assistant

## 2022-11-07 ENCOUNTER — Ambulatory Visit: Payer: BC Managed Care – PPO | Attending: Physician Assistant | Admitting: Physician Assistant

## 2022-11-07 VITALS — BP 142/92 | HR 67 | Ht 65.0 in | Wt 170.0 lb

## 2022-11-07 DIAGNOSIS — G8929 Other chronic pain: Secondary | ICD-10-CM | POA: Insufficient documentation

## 2022-11-07 DIAGNOSIS — F32A Depression, unspecified: Secondary | ICD-10-CM | POA: Insufficient documentation

## 2022-11-07 DIAGNOSIS — M545 Low back pain, unspecified: Secondary | ICD-10-CM | POA: Insufficient documentation

## 2022-11-07 DIAGNOSIS — M159 Polyosteoarthritis, unspecified: Secondary | ICD-10-CM | POA: Insufficient documentation

## 2022-11-07 DIAGNOSIS — I10 Essential (primary) hypertension: Secondary | ICD-10-CM | POA: Insufficient documentation

## 2022-11-07 DIAGNOSIS — M25511 Pain in right shoulder: Secondary | ICD-10-CM | POA: Insufficient documentation

## 2022-11-07 DIAGNOSIS — F419 Anxiety disorder, unspecified: Secondary | ICD-10-CM | POA: Insufficient documentation

## 2022-11-07 MED ORDER — TIZANIDINE 4 MG TABLET
4.0000 mg | ORAL_TABLET | Freq: Three times a day (TID) | ORAL | 0 refills | Status: DC | PRN
Start: 2022-11-07 — End: 2023-05-09

## 2022-11-07 MED ORDER — HYDROCODONE 7.5 MG-ACETAMINOPHEN 325 MG TABLET
1.0000 | ORAL_TABLET | Freq: Three times a day (TID) | ORAL | 0 refills | Status: DC | PRN
Start: 2022-11-07 — End: 2022-12-11

## 2022-11-07 MED ORDER — FLUOXETINE 10 MG CAPSULE
10.0000 mg | ORAL_CAPSULE | Freq: Every day | ORAL | 1 refills | Status: DC
Start: 2022-11-07 — End: 2023-02-06

## 2022-11-07 MED ORDER — SIMVASTATIN 40 MG TABLET
40.0000 mg | ORAL_TABLET | Freq: Every evening | ORAL | 1 refills | Status: DC
Start: 2022-11-07 — End: 2023-02-06

## 2022-11-07 MED ORDER — TRIAMCINOLONE ACETONIDE 40 MG/ML SUSPENSION FOR INJECTION
40.0000 mg | INTRAMUSCULAR | Status: AC
Start: 2022-11-07 — End: 2022-11-07
  Administered 2022-11-07: 40 mg via INTRAMUSCULAR

## 2022-11-07 NOTE — Assessment & Plan Note (Signed)
BP elevated today   To continue lisinopril 10 mg daily  To take BP at home w logs; if stays >140/90 to notify me or RTC

## 2022-11-07 NOTE — Assessment & Plan Note (Signed)
Continue klonopin 1mg  q hs

## 2022-11-07 NOTE — Assessment & Plan Note (Signed)
Continue Prozac 10 mg qd

## 2022-11-07 NOTE — Nursing Note (Signed)
Patient is here for routine 1 month follow up for medication refills.

## 2022-11-07 NOTE — Telephone Encounter (Signed)
Patient is here for routine 1 month follow up for medication refill.

## 2022-11-07 NOTE — Assessment & Plan Note (Signed)
Continue Norco prn.

## 2022-11-07 NOTE — Progress Notes (Signed)
[facility]INE, [facility]  [address]  [place] [address]          Follow Up        Reason for Visit: Follow Up (Routine 1 month ) Larey Seat the other day hurt RT shoulder    History of Present Illness  Gregory Case is a 67 y.o. male who is being seen today in office for follow-up as well as complaints of RT shoulder pain after tripping and falling this weekend. Pt states he thinks he just "stoved it up" and denies numbness tingling of arm. No neck pain noted. ROM intact however painful w raising overhead.    F/u  blood pressure/hypertension with reading today up initially at 144/88. Last visit BP 120/78 . Repeat this am still borderline at 142/92. Patient states he  monitors outpatient from time to time and readings are good. Overall he feels well denies any headaches dizziness chest pain. Continues to take prinivil 10 mg po daily.    F/U chronic anxiety/depresion w pt on prozac 10 mg daily as well as clonazepam 1mg  prn. Pt states he is doing well at this time and denies any panic attacks or increased anxiety/depression sx.     Follow-up chronic back pain/DJD/OA for which patient has been on long-term narcotic use for multiple years tolerated ;hx of TKR noted in past ; patient states he still has arthritic pains in the hands knees and sometimes between dosing of Norco he uses Duexis or motrin. States at this time back pain is stable  and no new issues to report and medication refills requested. No  abuse history noted and patient compliant with urine drug screen.    Colonoscopy up-to-date; will be due again in 2026      PHQ Questionnaire               Past Medical History:   Diagnosis Date    Abnormal glucose level     Anxiety     Chronic back pain     COVID-19 vaccine series completed     Depression     Mixed hyperlipidemia     Osteoarthritis          Past Surgical History:   Procedure Laterality Date    COLONOSCOPY W/ POLYPECTOMY      HX BACK SURGERY      HX CARPAL TUNNEL RELEASE      HX KNEE  REPLACMENT Right     HX TONSILLECTOMY        Family Medical History:       Problem Relation (Age of Onset)    Coronary Artery Disease Mother    Diabetes type II Mother    Hypertension (High Blood Pressure) Other            Social History     Tobacco Use    Smoking status: Never    Smokeless tobacco: Never   Substance Use Topics    Alcohol use: Yes     Comment: few times a week    Drug use: Never     Medication:  clonazePAM (KLONOPIN) 1 mg Oral Tablet, Take 1 Tablet (1 mg total) by mouth Every night  HYDROcodone-acetaminophen (NORCO) 7.5-325 mg Oral Tablet, Take 1 Tablet by mouth Three times a day as needed for Pain for up to 30 days  lisinopriL (PRINIVIL) 10 mg Oral Tablet, Take 1 Tablet (10 mg total) by mouth Once a day  omega 3-dha-epa-fish oil 300 mg (120 mg- 180mg )-1,000 mg Oral Capsule,  Take 1 Capsule by mouth Once a day  FLUoxetine (PROZAC) 10 mg Oral Capsule, Take 1 Capsule (10 mg total) by mouth Once a day  simvastatin (ZOCOR) 40 mg Oral Tablet, Take 1 Tablet (40 mg total) by mouth Every night  tiZANidine (ZANAFLEX) 4 mg Oral Tablet, Take 1 Tablet (4 mg total) by mouth Three times a day as needed    No facility-administered medications prior to visit.    Allergies:  Allergies   Allergen Reactions    Meloxicam Hives/ Urticaria       Physical Exam:  Vitals:    11/07/22 0924 11/07/22 0936   BP: (!) 144/88 (!) 142/92   Pulse: 67    SpO2: 97%    Weight: 77.1 kg (170 lb)    Height: 1.651 m (5\' 5" )    BMI: 28.35         Vitals reviewed  BP borderline high today  General appearance: alert, cooperative, in no acute distress  HEENT: Ears: clear, no effusion, no erythema; Throat: non-erythematous; no LAD, neck supple, moist mucus membranes  Lungs: clear to auscultation bilaterally; no wheezes or rhonchi   Heart: regular rate and rhythm; normal S1 & S2; no murmur  Abdomen: soft, non-tender, not distended; bowel sounds present  Extremities: extremities normal ROM, including RT shoulder however painful w abduction which  is intact; +mild tenderness on palp RT bursa; no weakness of the arm noted  +arthritic changes bilat hands/knees w hx of previous RT TKR - scar noted  Gait stable  no cyanosis or edema , no rash  Psych: alert and oriented x 3  Neuro: CN 2-12 grossly intact    Assessment & Plan  Acute pain of right shoulder  Hx of fall noted  Kenalog injection given today  May continue use of topical antiinflammatory  Essential hypertension  BP elevated today   To continue lisinopril 10 mg daily  To take BP at home w logs; if stays >140/90 to notify me or RTC  Anxiety  Continue klonopin 1mg  q hs  Depression, unspecified depression type  Continue Prozac 10 mg qd  Chronic low back pain, unspecified back pain laterality, unspecified whether sciatica present  Continue Norco prn  Osteoarthritis of multiple joints, unspecified osteoarthritis type       Labs currently up to date    Orders Placed This Encounter    simvastatin (ZOCOR) 40 mg Oral Tablet    tiZANidine (ZANAFLEX) 4 mg Oral Tablet    FLUoxetine (PROZAC) 10 mg Oral Capsule    triamcinolone acetonide (KENALOG-40) 40 mg/mL injection              Follow up:   Post-Discharge Follow Up Appointments       Thursday Dec 07, 2022    Return Patient Visit with Eyvonne Mechanic, PA-C at  9:00 AM      Monday Jan 08, 2023    Return Patient Visit with Eyvonne Mechanic, PA-C at  8:00 AM      Thursday May 31, 2023    Return Telephone Visit with Eyvonne Mechanic, PA-C at  1:00 PM      Internal Medicine, [facility]  [facility]  [address]  [place] [address]  [phone]           Seek medical attention for new or worsening symptoms.  Patient has been seen in this clinic within the last 3 years.     Farrell Pantaleo, PA-C  This note was partially created using MModal Fluency Direct system (voice recognition software) and is inherently subject to errors including those of syntax and "sound-alike" substitutions which may escape proofreading.  In such instances, original meaning may be  extrapolated by contextual derivation.

## 2022-11-20 ENCOUNTER — Ambulatory Visit: Payer: BC Managed Care – PPO | Attending: Physician Assistant | Admitting: Physician Assistant

## 2022-11-20 ENCOUNTER — Encounter (INDEPENDENT_AMBULATORY_CARE_PROVIDER_SITE_OTHER): Payer: Self-pay | Admitting: Physician Assistant

## 2022-11-20 ENCOUNTER — Other Ambulatory Visit: Payer: Self-pay

## 2022-11-20 VITALS — HR 73

## 2022-11-20 DIAGNOSIS — J029 Acute pharyngitis, unspecified: Secondary | ICD-10-CM | POA: Insufficient documentation

## 2022-11-20 DIAGNOSIS — R058 Other specified cough: Secondary | ICD-10-CM | POA: Insufficient documentation

## 2022-11-20 DIAGNOSIS — K30 Functional dyspepsia: Secondary | ICD-10-CM | POA: Insufficient documentation

## 2022-11-20 DIAGNOSIS — U071 COVID-19: Secondary | ICD-10-CM | POA: Insufficient documentation

## 2022-11-20 LAB — POCT RAPID COVID (SOFIA) (AMB ONLY): COVID-19 AG: POSITIVE — AB

## 2022-11-20 MED ORDER — CLONAZEPAM 1 MG TABLET
1.0000 mg | ORAL_TABLET | Freq: Every evening | ORAL | 2 refills | Status: DC
Start: 2022-11-20 — End: 2022-12-11

## 2022-11-20 MED ORDER — PROMETHAZINE-DM 6.25 MG-15 MG/5 ML ORAL SYRUP
5.0000 mL | ORAL_SOLUTION | Freq: Four times a day (QID) | ORAL | 0 refills | Status: DC | PRN
Start: 2022-11-20 — End: 2022-12-11

## 2022-11-20 MED ORDER — AMOXICILLIN 500 MG CAPSULE
500.0000 mg | ORAL_CAPSULE | Freq: Three times a day (TID) | ORAL | 0 refills | Status: DC
Start: 2022-11-20 — End: 2022-12-11

## 2022-11-20 MED ORDER — ONDANSETRON 8 MG DISINTEGRATING TABLET
8.0000 mg | ORAL_TABLET | Freq: Three times a day (TID) | ORAL | 0 refills | Status: DC | PRN
Start: 2022-11-20 — End: 2023-05-09

## 2022-11-20 MED ORDER — TRIAMCINOLONE ACETONIDE 40 MG/ML SUSPENSION FOR INJECTION
40.0000 mg | INTRAMUSCULAR | Status: AC
Start: 2022-11-20 — End: 2022-11-20
  Administered 2022-11-20: 40 mg via INTRAMUSCULAR

## 2022-11-20 NOTE — Nursing Note (Signed)
11/20/22 1300   Rapid Flu   Time Performed 1309   Rapid Flu A Result Negative   Rapid Flu B Result Negative   Initials [facility]   Rapid Strep   Time Performed 1309   Rapid Strep Negative   Nurse initials [facility]   Ordering Physician Amy Alvis,PA-C

## 2022-11-20 NOTE — Progress Notes (Signed)
INTERNAL MEDICINE, [facility]  [address]  [place] [address]  Operated by [facility]  Progress Note    Name: Gregory Case MRN:  [id number]   Date: 11/20/2022 DOB:  Apr 28, 1955 (67 y.o.)              Chief Complaint: Feel Sick       HPI: Gregory Case is a 67 y.o. male who complains of  stomach ache w nausea along w cough cong. Reports was  exposed to couple last wed who had just got over covid. Thurs am woke up sick to his stomach then on Friday started having cold sx.  No sob fever chills noted. +body aches noted along w sore throat.       Allergies:  Allergies   Allergen Reactions    Meloxicam Hives/ Urticaria       Current Medications:  FLUoxetine (PROZAC) 10 mg Oral Capsule, Take 1 Capsule (10 mg total) by mouth Once a day  HYDROcodone-acetaminophen (NORCO) 7.5-325 mg Oral Tablet, Take 1 Tablet by mouth Three times a day as needed for Pain for up to 30 days  lisinopriL (PRINIVIL) 10 mg Oral Tablet, Take 1 Tablet (10 mg total) by mouth Once a day  omega 3-dha-epa-fish oil 300 mg (120 mg- 180mg )-1,000 mg Oral Capsule, Take 1 Capsule by mouth Once a day  simvastatin (ZOCOR) 40 mg Oral Tablet, Take 1 Tablet (40 mg total) by mouth Every night  tiZANidine (ZANAFLEX) 4 mg Oral Tablet, Take 1 Tablet (4 mg total) by mouth Three times a day as needed  clonazePAM (KLONOPIN) 1 mg Oral Tablet, Take 1 Tablet (1 mg total) by mouth Every night    No facility-administered medications prior to visit.       Past Medical History:   Diagnosis Date    Abnormal glucose level     Anxiety     Chronic back pain     COVID-19 vaccine series completed     Depression     Mixed hyperlipidemia     Osteoarthritis            Social History     Tobacco Use    Smoking status: Never    Smokeless tobacco: Never   Substance Use Topics    Alcohol use: Yes     Comment: few times a week    Drug use: Never       OBJECTIVE:  Vitals:    11/20/22 1314   Pulse: 73   SpO2: 97%      Physical Exam  Vitals and nursing  note reviewed.   Constitutional:       General: He is not in acute distress.     Appearance: Normal appearance.   HENT:      Right Ear: Tympanic membrane normal.      Left Ear: Tympanic membrane normal.      Mouth/Throat:      Mouth: Mucous membranes are moist.      Pharynx: Oropharynx is clear.   Eyes:      Pupils: Pupils are equal, round, and reactive to light.   Cardiovascular:      Rate and Rhythm: Normal rate and regular rhythm.      Pulses: Normal pulses.      Heart sounds: Normal heart sounds.   Pulmonary:      Effort: Pulmonary effort is normal.      Breath sounds: Normal breath sounds.   Abdominal:      General: Bowel sounds  are normal.      Palpations: Abdomen is soft.      Tenderness: There is no abdominal tenderness.   Musculoskeletal:      Cervical back: Normal range of motion and neck supple.   Skin:     General: Skin is warm.      Findings: No rash.   Neurological:      Mental Status: He is alert and oriented to person, place, and time.   Psychiatric:         Mood and Affect: Mood normal.         Behavior: Behavior normal.         Thought Content: Thought content normal.         Judgment: Judgment normal.          Rapid Flu Results:   Rapid Flu A Result: Negative  Rapid Flu B Result: Negative     Rapid Strep Results:   Negative     COVID Results   POSITIVE          Assessment & plan:  Assessment & Plan  COVID-19  PT NOT A CANDIDATE FOR ANTIVIRAL DUE TO >5 DAYS SX  TO INCREASE VIT C /ZINC /FLUIDS  ANY ACUTE ISSUES SEEK ER  Respiratory tract congestion with cough  KENALOG INJECTION GIVEN  AMOXIL 500 MG TID X 10 DAYS  PROMETH DM PRN COUGH  SX NO BETTER BY THURS TO TAKE PRED 20 MG BID X 5 DAYS  Upset stomach  ZOFRAN PRN   Sore throat  TYLENOL/MOTRIN PRN   WARM SALT GARGLES PRN       PLAN:     Orders Placed This Encounter    POCT RAPID STREP A    POCT Rapid Covid ([facility]) (AMB Only)    POCT Influenza A & B    triamcinolone acetonide (KENALOG-40) 40 mg/mL injection    ondansetron (ZOFRAN ODT) 8 mg Oral  Tablet, Rapid Dissolve    clonazePAM (KLONOPIN) 1 mg Oral Tablet    promethazine-dextromethorphan (PHENERGAN-DM) 6.25-15 mg/5 mL Oral Syrup    amoxicillin (AMOXIL) 500 mg Oral Capsule      Post-Discharge Follow Up Appointments       Monday Dec 11, 2022    Return Patient Visit with Eyvonne Mechanic, PA-C at  8:00 AM      Monday Jan 08, 2023    Return Patient Visit with Eyvonne Mechanic, PA-C at  8:00 AM      Tuesday Feb 06, 2023    Return Patient Visit with Eyvonne Mechanic, PA-C at  9:00 AM      Tuesday Mar 06, 2023    Return Patient Visit with Eyvonne Mechanic, PA-C at  8:30 AM      Tuesday Apr 03, 2023    Return Patient Visit with Eyvonne Mechanic, PA-C at  8:30 AM      Thursday May 31, 2023    Return Telephone Visit with Eyvonne Mechanic, PA-C at  1:00 PM      Internal Medicine, [facility]  [facility], [place]  [address]  [place] [address]  [phone]             This note was partially created using MModal Fluency Direct system (voice recognition software) and is inherently subject to errors including those of syntax and "sound-alike" substitutions which may escape proofreading.  In such instances, original meaning may be extrapolated by contextual derivation.    Nakia Remmers, PA-C

## 2022-11-30 ENCOUNTER — Ambulatory Visit (INDEPENDENT_AMBULATORY_CARE_PROVIDER_SITE_OTHER): Payer: Self-pay | Admitting: Physician Assistant

## 2022-12-07 ENCOUNTER — Ambulatory Visit (INDEPENDENT_AMBULATORY_CARE_PROVIDER_SITE_OTHER): Payer: Self-pay | Admitting: Physician Assistant

## 2022-12-11 ENCOUNTER — Other Ambulatory Visit: Payer: Self-pay

## 2022-12-11 ENCOUNTER — Ambulatory Visit: Payer: BC Managed Care – PPO | Attending: Physician Assistant | Admitting: Physician Assistant

## 2022-12-11 ENCOUNTER — Encounter (INDEPENDENT_AMBULATORY_CARE_PROVIDER_SITE_OTHER): Payer: Self-pay | Admitting: Physician Assistant

## 2022-12-11 ENCOUNTER — Other Ambulatory Visit (INDEPENDENT_AMBULATORY_CARE_PROVIDER_SITE_OTHER): Payer: Self-pay | Admitting: Physician Assistant

## 2022-12-11 VITALS — BP 140/86 | HR 80 | Ht 65.0 in | Wt 169.0 lb

## 2022-12-11 DIAGNOSIS — Z7689 Persons encountering health services in other specified circumstances: Secondary | ICD-10-CM | POA: Insufficient documentation

## 2022-12-11 DIAGNOSIS — M170 Bilateral primary osteoarthritis of knee: Secondary | ICD-10-CM | POA: Insufficient documentation

## 2022-12-11 DIAGNOSIS — Z79899 Other long term (current) drug therapy: Secondary | ICD-10-CM | POA: Insufficient documentation

## 2022-12-11 DIAGNOSIS — F32A Depression, unspecified: Secondary | ICD-10-CM | POA: Insufficient documentation

## 2022-12-11 DIAGNOSIS — M545 Low back pain, unspecified: Secondary | ICD-10-CM | POA: Insufficient documentation

## 2022-12-11 DIAGNOSIS — F419 Anxiety disorder, unspecified: Secondary | ICD-10-CM | POA: Insufficient documentation

## 2022-12-11 DIAGNOSIS — G8929 Other chronic pain: Secondary | ICD-10-CM | POA: Insufficient documentation

## 2022-12-11 DIAGNOSIS — M159 Polyosteoarthritis, unspecified: Secondary | ICD-10-CM

## 2022-12-11 DIAGNOSIS — Z23 Encounter for immunization: Secondary | ICD-10-CM | POA: Insufficient documentation

## 2022-12-11 DIAGNOSIS — Z79891 Long term (current) use of opiate analgesic: Secondary | ICD-10-CM | POA: Insufficient documentation

## 2022-12-11 DIAGNOSIS — I10 Essential (primary) hypertension: Secondary | ICD-10-CM | POA: Insufficient documentation

## 2022-12-11 MED ORDER — LISINOPRIL 10 MG TABLET
10.0000 mg | ORAL_TABLET | Freq: Every day | ORAL | 1 refills | Status: DC
Start: 2022-12-11 — End: 2023-02-06

## 2022-12-11 MED ORDER — CLONAZEPAM 1 MG TABLET
1.0000 mg | ORAL_TABLET | Freq: Every evening | ORAL | 2 refills | Status: DC
Start: 2022-12-11 — End: 2023-02-06

## 2022-12-11 NOTE — Assessment & Plan Note (Signed)
On chronic narcotic therapy  Continue Norco 7.5mg  tid - refills given

## 2022-12-11 NOTE — Progress Notes (Signed)
INTERNAL [facility]ICINE[facility]  [address]  [place] [address]          Follow Up        Reason for Visit: Follow Up (Routine 1 month ) needs flu shot    History of Present Illness  Gregory Case is a 67 y.o. male who is being seen today in office for blood pressure/hypertension with reading today up slightly at 140/86 however he has not had his BP med in 2 days.  He also just came back from a cruise so ate whatever he wanted. Overall he feels well denies any headaches dizziness chest pain. Continues to take prinivil 10 mg po daily.    F/U chronic anxiety/depresion w pt on prozac 10 mg daily as well as clonazepam 1mg  prn. Pt states he is doing well at this time and denies any panic attacks or increased anxiety/depression sx.     Follow-up chronic back pain/DJD/OA for which patient has been on long-term narcotic use for multiple years tolerated ;hx of TKR noted in past ; patient states he still has arthritic pains in the hands knees and sometimes between dosing of Norco he uses Duexis or motrin. States at this time back pain is stable  and no new issues to report and medication refills requested. No  abuse history noted and patient compliant with urine drug screen.    Colonoscopy up-to-date; will be due again in 2026    Needs flu shot    PHQ Questionnaire         Past Medical History:   Diagnosis Date    Abnormal glucose level     Anxiety     Chronic back pain     COVID-19 vaccine series completed     Depression     Mixed hyperlipidemia     Osteoarthritis          Past Surgical History:   Procedure Laterality Date    COLONOSCOPY W/ POLYPECTOMY      HX BACK SURGERY      HX CARPAL TUNNEL RELEASE      HX KNEE REPLACMENT Right     HX TONSILLECTOMY        Family Medical History:       Problem Relation (Age of Onset)    Coronary Artery Disease Mother    Diabetes type II Mother    Hypertension (High Blood Pressure) Other            Social History     Tobacco Use    Smoking status: Never    Smokeless  tobacco: Never   Substance Use Topics    Alcohol use: Yes     Comment: few times a week    Drug use: Never     Medication:  FLUoxetine (PROZAC) 10 mg Oral Capsule, Take 1 Capsule (10 mg total) by mouth Once a day  omega 3-dha-epa-fish oil 300 mg (120 mg- 180mg )-1,000 mg Oral Capsule, Take 1 Capsule by mouth Once a day  ondansetron (ZOFRAN ODT) 8 mg Oral Tablet, Rapid Dissolve, Place 1 Tablet (8 mg total) under the tongue Every 8 hours as needed for Nausea/Vomiting  simvastatin (ZOCOR) 40 mg Oral Tablet, Take 1 Tablet (40 mg total) by mouth Every night  tiZANidine (ZANAFLEX) 4 mg Oral Tablet, Take 1 Tablet (4 mg total) by mouth Three times a day as needed  amoxicillin (AMOXIL) 500 mg Oral Capsule, Take 1 Capsule (500 mg total) by mouth Three times a day  clonazePAM (KLONOPIN) 1  mg Oral Tablet, Take 1 Tablet (1 mg total) by mouth Every night  lisinopriL (PRINIVIL) 10 mg Oral Tablet, Take 1 Tablet (10 mg total) by mouth Once a day  promethazine-dextromethorphan (PHENERGAN-DM) 6.25-15 mg/5 mL Oral Syrup, Take 5 mL by mouth Four times a day as needed for Cough for up to 7 days    No facility-administered medications prior to visit.    Allergies:  Allergies   Allergen Reactions    Meloxicam Hives/ Urticaria       Physical Exam:  Vitals:    12/11/22 0839   BP: (!) 140/86   Pulse: 80   SpO2: 98%   Weight: 76.7 kg (169 lb)   Height: 1.651 m (5\' 5" )   BMI: 28.18        Vitals reviewed  General appearance: alert, cooperative, in no acute distress  HEENT: Ears: clear, no effusion, no erythema; Throat: non-erythematous; no LAD, neck supple, moist mucus membranes  Lungs: clear to auscultation bilaterally; no wheezes or rhonchi   Heart: regular rate and rhythm; normal S1 & S2; no murmur  Abdomen: soft, non-tender, not distended; bowel sounds present  Extremities: extremities normal ROM, +arthritic changes bilat hands/knees w hx of previous RT TKR - scar noted  Gait stable  no cyanosis or edema , no rash  Psych: alert and oriented  x 3  Neuro: CN 2-12 grossly intact      Assessment & Plan  Essential hypertension  Pt to take BP med when home   Continue low NA diet along w BP monitoring  Anxiety  Continue klonopin 1mg  nightly- refills given  Depression, unspecified depression type  Continue Prozac 10 mg qd  Chronic low back pain, unspecified back pain laterality, unspecified whether sciatica present  On chronic narcotic therapy  Continue Norco 7.5mg  tid - refills given  Osteoarthritis of multiple joints, unspecified osteoarthritis type    Influenza vaccination administered at current visit  Flu shot given     Orders Placed This Encounter    Flu Vaccine, 6 month-adult,0.5 mL IM (Admin)    clonazePAM (KLONOPIN) 1 mg Oral Tablet    lisinopriL (PRINIVIL) 10 mg Oral Tablet             Follow up:   Post-Discharge Follow Up Appointments       Monday Jan 08, 2023    Return Patient Visit with Eyvonne Mechanic, PA-C at  8:00 AM      Tuesday Feb 06, 2023    Return Patient Visit with Eyvonne Mechanic, PA-C at  9:00 AM      Tuesday Mar 06, 2023    Return Patient Visit with Eyvonne Mechanic, PA-C at  8:30 AM      Tuesday Apr 03, 2023    Return Patient Visit with Eyvonne Mechanic, PA-C at  8:30 AM      Thursday May 31, 2023    Return Telephone Visit with Eyvonne Mechanic, PA-C at  1:00 PM      Internal Medicine, [facility]  [facility]  [address]  [place] [address]  [phone]           Seek medical attention for new or worsening symptoms.  Patient has been seen in this clinic within the last 3 years.     Stephonie Wilcoxen, PA-C          This note was partially created using MModal Fluency Direct system (voice recognition software) and is inherently subject to errors including those of syntax and "  sound-alike" substitutions which may escape proofreading.  In such instances, original meaning may be extrapolated by contextual derivation.

## 2022-12-11 NOTE — Assessment & Plan Note (Signed)
Continue klonopin 1mg  nightly- refills given

## 2022-12-11 NOTE — Telephone Encounter (Signed)
Patient is in the office today for a routine 1 month follow up for medication refill.

## 2022-12-11 NOTE — Nursing Note (Signed)
Patient is here for routine 1 month follow up for medication refills.

## 2022-12-11 NOTE — Assessment & Plan Note (Signed)
Pt to take BP med when home   Continue low NA diet along w BP monitoring

## 2022-12-11 NOTE — Assessment & Plan Note (Signed)
Continue Prozac 10 mg qd

## 2022-12-12 MED ORDER — HYDROCODONE 7.5 MG-ACETAMINOPHEN 325 MG TABLET
1.0000 | ORAL_TABLET | Freq: Three times a day (TID) | ORAL | 0 refills | Status: DC | PRN
Start: 2022-12-12 — End: 2023-01-09

## 2023-01-08 ENCOUNTER — Other Ambulatory Visit: Payer: Self-pay

## 2023-01-08 ENCOUNTER — Ambulatory Visit: Payer: BC Managed Care – PPO | Attending: Physician Assistant | Admitting: Physician Assistant

## 2023-01-08 ENCOUNTER — Encounter (INDEPENDENT_AMBULATORY_CARE_PROVIDER_SITE_OTHER): Payer: Self-pay | Admitting: Physician Assistant

## 2023-01-08 VITALS — BP 136/88 | HR 67 | Temp 98.2°F | Ht 65.0 in | Wt 168.6 lb

## 2023-01-08 DIAGNOSIS — G8929 Other chronic pain: Secondary | ICD-10-CM | POA: Insufficient documentation

## 2023-01-08 DIAGNOSIS — F419 Anxiety disorder, unspecified: Secondary | ICD-10-CM | POA: Insufficient documentation

## 2023-01-08 DIAGNOSIS — M159 Polyosteoarthritis, unspecified: Secondary | ICD-10-CM | POA: Insufficient documentation

## 2023-01-08 DIAGNOSIS — F32A Depression, unspecified: Secondary | ICD-10-CM | POA: Insufficient documentation

## 2023-01-08 DIAGNOSIS — M545 Low back pain, unspecified: Secondary | ICD-10-CM | POA: Insufficient documentation

## 2023-01-08 DIAGNOSIS — I10 Essential (primary) hypertension: Secondary | ICD-10-CM | POA: Insufficient documentation

## 2023-01-08 DIAGNOSIS — M5416 Radiculopathy, lumbar region: Secondary | ICD-10-CM | POA: Insufficient documentation

## 2023-01-08 MED ORDER — TRIAMCINOLONE ACETONIDE 40 MG/ML SUSPENSION FOR INJECTION
40.0000 mg | INTRAMUSCULAR | Status: AC
Start: 2023-01-08 — End: 2023-01-08
  Administered 2023-01-08: 40 mg via INTRAMUSCULAR

## 2023-01-08 MED ORDER — GABAPENTIN 300 MG CAPSULE
300.0000 mg | ORAL_CAPSULE | Freq: Every evening | ORAL | 0 refills | Status: DC
Start: 2023-01-08 — End: 2023-03-06

## 2023-01-08 MED ORDER — PREDNISONE 20 MG TABLET
20.0000 mg | ORAL_TABLET | Freq: Two times a day (BID) | ORAL | 0 refills | Status: AC
Start: 2023-01-08 — End: 2023-01-13

## 2023-01-08 NOTE — Assessment & Plan Note (Signed)
Stable on current meds  Continue BP monitoring outpt

## 2023-01-08 NOTE — Assessment & Plan Note (Signed)
On chronic pain management  Continue Norco 7.5mg  tid - refills given

## 2023-01-08 NOTE — Assessment & Plan Note (Signed)
Stable on klonopin 1mg  q hs

## 2023-01-08 NOTE — Progress Notes (Signed)
[facility]  [address]  [place] [address]          Follow Up        Reason for Visit: Follow Up pain in back and left leg    History of Present Illness  Gregory Case is a 67 y.o. male who is being seen today in office for f/u however has had acute lower back pain radiating into his left leg x 4 days.  States wife had him lifting furniture prior to pain starting.  States pain started into left lateral hip and lower back but now shooting down his left lateral leg. Pain described as an ache down his leg then a stabbing needle pain that does not ease up. Has been using his pain med w Advil but has not slept well since pain started. Hx of chronic back pain noted.    F/u blood pressure/hypertension with reading today 136/88. He denies any headaches dizziness chest pain. Continues to take prinivil 10 mg po daily.    F/U chronic anxiety/depresion w pt on prozac 10 mg daily as well as clonazepam 1mg  prn. Pt states he is doing well at this time and denies any panic attacks or increased anxiety/depression sx.     Follow-up chronic back pain/DJD/OA for which patient has been on long-term narcotic use for multiple years tolerated. + hx of TKR noted in past. Pt states he still has arthritic pains in the hands knees and sometimes between dosing of Norco he uses Duexis or motrin. Request medication refills on Norco today. No  abuse history noted and patient compliant with urine drug screen.    Colonoscopy up-to-date; will be due again in 2026    Flu shot up to date    PHQ Questionnaire         Past Medical History:   Diagnosis Date    Abnormal glucose level     Anxiety     Chronic back pain     COVID-19 vaccine series completed     Depression     Mixed hyperlipidemia     Osteoarthritis          Past Surgical History:   Procedure Laterality Date    COLONOSCOPY W/ POLYPECTOMY      HX BACK SURGERY      HX CARPAL TUNNEL RELEASE      HX KNEE REPLACMENT Right     HX TONSILLECTOMY        Family  Medical History:       Problem Relation (Age of Onset)    Coronary Artery Disease Mother    Diabetes type II Mother    Hypertension (High Blood Pressure) Other            Social History     Tobacco Use    Smoking status: Never    Smokeless tobacco: Never   Substance Use Topics    Alcohol use: Yes     Comment: few times a week    Drug use: Never     Medication:  clonazePAM (KLONOPIN) 1 mg Oral Tablet, Take 1 Tablet (1 mg total) by mouth Every night  FLUoxetine (PROZAC) 10 mg Oral Capsule, Take 1 Capsule (10 mg total) by mouth Once a day  HYDROcodone-acetaminophen (NORCO) 7.5-325 mg Oral Tablet, Take 1 Tablet by mouth Three times a day as needed for Pain for up to 30 days  lisinopriL (PRINIVIL) 10 mg Oral Tablet, Take 1 Tablet (10 mg total) by mouth Once a day  omega 3-dha-epa-fish oil 300 mg (120 mg- 180mg )-1,000 mg Oral Capsule, Take 1 Capsule by mouth Once a day  ondansetron (ZOFRAN ODT) 8 mg Oral Tablet, Rapid Dissolve, Place 1 Tablet (8 mg total) under the tongue Every 8 hours as needed for Nausea/Vomiting  simvastatin (ZOCOR) 40 mg Oral Tablet, Take 1 Tablet (40 mg total) by mouth Every night  tiZANidine (ZANAFLEX) 4 mg Oral Tablet, Take 1 Tablet (4 mg total) by mouth Three times a day as needed    No facility-administered medications prior to visit.    Allergies:  Allergies   Allergen Reactions    Meloxicam Hives/ Urticaria       Physical Exam:  Vitals:    01/08/23 1355   BP: 136/88   Pulse: 67   Temp: 36.8 C (98.2 F)   SpO2: 98%   Weight: 76.5 kg (168 lb 9.6 oz)   Height: 1.651 m (5\' 5" )   BMI: 28.12        Vitals reviewed  General appearance: alert, cooperative, in no acute distress  HEENT: Ears: clear, no effusion, no erythema; Throat: non-erythematous; no LAD, neck supple, moist mucus membranes  Lungs: clear to auscultation bilaterally; no wheezes or rhonchi   Heart: regular rate and rhythm; normal S1 & S2; no murmur  Abdomen: soft, non-tender, not distended; bowel sounds present  Extremities: extremities  normal ROM, +arthritic changes bilat hands/knees w hx of previous RT TKR - scar noted  +decreased forward flexion of lower back due to pain w straight leg raise on left + at 70-80 degrees  ROM of left hip intact  +limp left leg today however gait stable  no cyanosis or edema , no rash  Psych: alert and oriented x 3  Neuro: CN 2-12 grossly intact    Assessment & Plan  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  Kenalog injection given today in office  Will start pred 20 mg bid x 5 days in am  Gabapentin 300 mg q hs x 2 weeks  If pain no better by next week pt to RTC at which time will order xray /mri of lumbar spine  Essential hypertension  Stable on current meds  Continue BP monitoring outpt  Anxiety  Stable on klonopin 1mg  q hs   Depression, unspecified depression type  Stable on Prozac 10 mg q hs  Chronic low back pain, unspecified back pain laterality, unspecified whether sciatica present  On chronic pain management  Continue Norco 7.5mg  tid - refills given  Osteoarthritis of multiple joints, unspecified osteoarthritis type       Orders Placed This Encounter    triamcinolone acetonide (KENALOG-40) 40 mg/mL injection    predniSONE (DELTASONE) 20 mg Oral Tablet    gabapentin (NEURONTIN) 300 mg Oral Capsule             Follow up:   Post-Discharge Follow Up Appointments       Monday Jan 08, 2023    Return Patient Visit with Eyvonne Mechanic, PA-C at  8:00 AM      Tuesday Feb 06, 2023    Return Patient Visit with Eyvonne Mechanic, PA-C at  9:00 AM      Tuesday Mar 06, 2023    Return Patient Visit with Eyvonne Mechanic, PA-C at  8:30 AM      Tuesday Apr 03, 2023    Return Patient Visit with Eyvonne Mechanic, PA-C at  8:30 AM      Thursday May 31, 2023  Return Telephone Visit with Eyvonne Mechanic, PA-C at  1:00 PM      Internal Medicine, Building [facility]  [facility]  [address]  [place] [address]  [phone]           Seek medical attention for new or worsening symptoms.  Patient has been seen in this  clinic within the last 3 years.     Itha Kroeker Hyman Bible, PA-C      I personally reviewed the documentation of care provided by the certified physician assistant and I agree with her medical decision making, Weyman Pedro, D.O.       This note was partially created using MModal Fluency Direct system (voice recognition software) and is inherently subject to errors including those of syntax and "sound-alike" substitutions which may escape proofreading.  In such instances, original meaning may be extrapolated by contextual derivation.

## 2023-01-08 NOTE — Assessment & Plan Note (Signed)
Stable on Prozac 10 mg q hs

## 2023-01-09 ENCOUNTER — Other Ambulatory Visit (INDEPENDENT_AMBULATORY_CARE_PROVIDER_SITE_OTHER): Payer: Self-pay | Admitting: Physician Assistant

## 2023-01-09 MED ORDER — HYDROCODONE 7.5 MG-ACETAMINOPHEN 325 MG TABLET
1.0000 | ORAL_TABLET | Freq: Three times a day (TID) | ORAL | 0 refills | Status: DC | PRN
Start: 2023-01-09 — End: 2023-02-06

## 2023-01-09 NOTE — Telephone Encounter (Signed)
Patient had routine 1 month follow up yesterday and needs refill on medication.

## 2023-02-05 NOTE — Progress Notes (Signed)
Procedures:    ILESI L5-S1      Performed by: Campbell Stall, MD  Authorized by: Campbell Stall, MD      Consent Given by:  Patient  Site marked: the procedure site was marked    Timeout: prior to procedure the correct patient, procedure, and site was verified    Preparation:  Skin prepped with Hibiclens  Procedure:  ILESI  Indications:  Therapeutic benefit  Guidance:  Fluoroscopy  Levels:  L5-S1  Site(s):     Additional Procedural Details:  I described the procedure to the patient and all questions regarding the procedure were answered.  The potential risks, side effects, and complications of the procedure, including, but not necessarily limited to bleeding, infection, allergic reaction, local tissue injury, and nerve injury were reviewed by the patient.  The patient consented to proceed.       The patient's heart rate, blood pressure, temperature, and respiratory rate were all within an acceptable range prior to being taken back to the procedure room.  The patient was placed in the prone position on the fluoroscopy table.  A timeout was held for patient and procedural verification.  The lumbar region was prepped and draped using sterile technique.  The target interspace was identified fluoroscopically via an AP view and the skin over the target area was anesthetized via a 27-gauge 1.5 inch needle with 1% lidocaine. An 18-gauge Tuohy needle was then atraumatically introduced and advanced under fluoroscopic guidance, alternating between AP and lateral views, toward the posterior epidural space via a paramedian approach.  Using loss of resistance technique with saline, the epidural space was entered. Following negative aspiration, injection of contrast confirmed epidural flow without vascular or intrathecal uptake.  Then the corticosteroid solution was slowly injected without complication.  The needle was removed.  The patient tolerated the procedure without apparent complications and was observed for approximately  10 minutes before being discharged in excellent condition.        Pt Tolerated Procedure:  Well

## 2023-02-06 ENCOUNTER — Other Ambulatory Visit (INDEPENDENT_AMBULATORY_CARE_PROVIDER_SITE_OTHER): Payer: Self-pay | Admitting: Physician Assistant

## 2023-02-06 ENCOUNTER — Encounter (INDEPENDENT_AMBULATORY_CARE_PROVIDER_SITE_OTHER): Payer: Self-pay | Admitting: Physician Assistant

## 2023-02-06 ENCOUNTER — Other Ambulatory Visit: Payer: Self-pay

## 2023-02-06 ENCOUNTER — Ambulatory Visit: Payer: BC Managed Care – PPO | Attending: Physician Assistant | Admitting: Physician Assistant

## 2023-02-06 VITALS — BP 128/78 | HR 83 | Ht 65.0 in | Wt 168.0 lb

## 2023-02-06 DIAGNOSIS — I10 Essential (primary) hypertension: Secondary | ICD-10-CM | POA: Insufficient documentation

## 2023-02-06 DIAGNOSIS — M545 Low back pain, unspecified: Secondary | ICD-10-CM | POA: Insufficient documentation

## 2023-02-06 DIAGNOSIS — M159 Polyosteoarthritis, unspecified: Secondary | ICD-10-CM | POA: Insufficient documentation

## 2023-02-06 DIAGNOSIS — N400 Enlarged prostate without lower urinary tract symptoms: Secondary | ICD-10-CM | POA: Insufficient documentation

## 2023-02-06 DIAGNOSIS — Z79899 Other long term (current) drug therapy: Secondary | ICD-10-CM | POA: Insufficient documentation

## 2023-02-06 DIAGNOSIS — G8929 Other chronic pain: Secondary | ICD-10-CM | POA: Insufficient documentation

## 2023-02-06 DIAGNOSIS — F419 Anxiety disorder, unspecified: Secondary | ICD-10-CM | POA: Insufficient documentation

## 2023-02-06 DIAGNOSIS — F341 Dysthymic disorder: Secondary | ICD-10-CM | POA: Insufficient documentation

## 2023-02-06 DIAGNOSIS — R7309 Other abnormal glucose: Secondary | ICD-10-CM | POA: Insufficient documentation

## 2023-02-06 DIAGNOSIS — E782 Mixed hyperlipidemia: Secondary | ICD-10-CM | POA: Insufficient documentation

## 2023-02-06 DIAGNOSIS — E559 Vitamin D deficiency, unspecified: Secondary | ICD-10-CM | POA: Insufficient documentation

## 2023-02-06 LAB — COMPREHENSIVE METABOLIC PNL, FASTING
ALBUMIN/GLOBULIN RATIO: 1.6 — ABNORMAL HIGH (ref 0.8–1.4)
ALBUMIN: 4.9 g/dL (ref 3.5–5.7)
ALKALINE PHOSPHATASE: 37 U/L (ref 34–104)
ALT (SGPT): 18 U/L (ref 7–52)
ANION GAP: 12 mmol/L (ref 4–13)
AST (SGOT): 20 U/L (ref 13–39)
BILIRUBIN TOTAL: 0.5 mg/dL (ref 0.3–1.0)
BUN/CREA RATIO: 15 (ref 6–22)
BUN: 13 mg/dL (ref 7–25)
CALCIUM, CORRECTED: 9.3 mg/dL (ref 8.9–10.8)
CALCIUM: 10 mg/dL (ref 8.6–10.3)
CHLORIDE: 102 mmol/L (ref 98–107)
CO2 TOTAL: 25 mmol/L (ref 21–31)
CREATININE: 0.84 mg/dL (ref 0.60–1.30)
ESTIMATED GFR: 95 mL/min/{1.73_m2} (ref >59)
GLOBULIN: 3 (ref 2.0–3.5)
GLUCOSE: 111 mg/dL — ABNORMAL HIGH (ref 74–109)
OSMOLALITY, CALCULATED: 278 mosm/kg (ref 270–290)
POTASSIUM: 3.7 mmol/L (ref 3.5–5.1)
PROTEIN TOTAL: 7.9 g/dL (ref 6.4–8.9)
SODIUM: 139 mmol/L (ref 136–145)

## 2023-02-06 LAB — CBC WITH DIFF
BASOPHIL #: 0 10*3/uL (ref 0.00–0.10)
BASOPHIL %: 0 % (ref 0–1)
EOSINOPHIL #: 0 10*3/uL (ref 0.00–0.50)
EOSINOPHIL %: 0 % — ABNORMAL LOW (ref 1–8)
HCT: 45.1 % (ref 36.7–47.1)
HGB: 15 g/dL (ref 12.5–16.3)
LYMPHOCYTE #: 1.6 10*3/uL (ref 1.00–3.00)
LYMPHOCYTE %: 6 % — ABNORMAL LOW (ref 16–44)
MCH: 28.8 pg (ref 23.8–33.4)
MCHC: 33.4 g/dL (ref 32.5–36.3)
MCV: 86.4 fL (ref 73.0–96.2)
MONOCYTE #: 1.3 10*3/uL — ABNORMAL HIGH (ref 0.30–1.00)
MONOCYTE %: 5 % (ref 5–13)
MPV: 9.7 fL (ref 7.4–11.4)
NEUTROPHIL #: 22.8 10*3/uL — ABNORMAL HIGH (ref 1.85–7.80)
NEUTROPHIL %: 89 % — ABNORMAL HIGH (ref 43–77)
PLATELETS: 321 10*3/uL (ref 140–440)
RBC: 5.22 10*6/uL (ref 4.06–5.63)
RDW: 14.2 % (ref 12.1–16.2)
WBC: 25.7 10*3/uL — ABNORMAL HIGH (ref 3.6–10.2)

## 2023-02-06 LAB — LIPID PANEL
CHOL/HDL RATIO: 3.1
CHOLESTEROL: 231 mg/dL — ABNORMAL HIGH (ref <200)
HDL CHOL: 75 mg/dL (ref >=40)
LDL CALC: 138 mg/dL — ABNORMAL HIGH (ref 0–100)
TRIGLYCERIDES: 88 mg/dL (ref <=150)
VLDL CALC: 18 mg/dL (ref 0–50)

## 2023-02-06 LAB — DRUG SCREEN, NO CONFIRMATION, URINE
AMPHETAMINES URINE: NEGATIVE
BARBITURATES URINE: NEGATIVE
BENZODIAZEPINES URINE: NEGATIVE
BUPRENORPHINE URINE: NEGATIVE
CANNABINOIDS URINE: POSITIVE — AB
COCAINE METABOLITES URINE: NEGATIVE
FENTANYL, URINE: NEGATIVE
METHADONE URINE: NEGATIVE
OPIATES URINE: POSITIVE — AB
OXYCODONE URINE: NEGATIVE
PCP URINE: NEGATIVE

## 2023-02-06 LAB — MICROALBUMIN/CREATININE RATIO, URINE, RANDOM
CREATININE RANDOM URINE: 176 mg/dL — ABNORMAL HIGH (ref 30–125)
MICROALBUMIN RANDOM URINE: 1.4 mg/dL
MICROALBUMIN/CREATININE RATIO RANDOM URINE: 8 mg/g

## 2023-02-06 LAB — VITAMIN B12: VITAMIN B 12: 198 pg/mL (ref 180–914)

## 2023-02-06 LAB — IRON TRANSFERRIN AND TIBC
IRON (TRANSFERRIN) SATURATION: 29 % (ref 20–50)
IRON: 124 ug/dL (ref 50–212)
TOTAL IRON BINDING CAPACITY: 435 ug/dL (ref 250–450)
TRANSFERRIN: 311 mg/dL (ref 203–362)
UIBC: 311 ug/dL (ref 130–375)

## 2023-02-06 LAB — THYROID STIMULATING HORMONE WITH FREE T4 REFLEX: TSH: 0.404 u[IU]/mL — ABNORMAL LOW (ref 0.450–5.330)

## 2023-02-06 LAB — PSA, DIAGNOSTIC: PSA: 0.64 ng/mL (ref <=4.00)

## 2023-02-06 LAB — THYROXINE, FREE (FREE T4): THYROXINE (T4), FREE: 0.92 ng/dL (ref 0.61–1.12)

## 2023-02-06 MED ORDER — FLUOXETINE 10 MG CAPSULE
10.0000 mg | ORAL_CAPSULE | Freq: Every day | ORAL | 1 refills | Status: DC
Start: 2023-02-06 — End: 2023-04-10

## 2023-02-06 MED ORDER — LISINOPRIL 10 MG TABLET
10.0000 mg | ORAL_TABLET | Freq: Every day | ORAL | 1 refills | Status: DC
Start: 2023-02-06 — End: 2023-04-10

## 2023-02-06 MED ORDER — CLONAZEPAM 1 MG TABLET
1.0000 mg | ORAL_TABLET | Freq: Every evening | ORAL | 2 refills | Status: DC
Start: 2023-02-06 — End: 2023-03-06

## 2023-02-06 MED ORDER — SIMVASTATIN 40 MG TABLET
40.0000 mg | ORAL_TABLET | Freq: Every evening | ORAL | 1 refills | Status: DC
Start: 2023-02-06 — End: 2023-04-10

## 2023-02-06 NOTE — Assessment & Plan Note (Signed)
Stable on klonopin.

## 2023-02-06 NOTE — Telephone Encounter (Signed)
Patient is in the office today for routine 1 month follow up for medication refill.

## 2023-02-06 NOTE — Assessment & Plan Note (Signed)
PSA obtained today.

## 2023-02-06 NOTE — Assessment & Plan Note (Signed)
Stable on current meds 

## 2023-02-06 NOTE — Assessment & Plan Note (Signed)
On chronic pain meds  Continue Norco 7.5mg  tid - refills given  [facility]

## 2023-02-06 NOTE — Progress Notes (Signed)
[facility]  [address]  [place] [address]          Follow Up        Reason for Visit: Follow Up (Routine 1 month ) labs    History of Present Illness  Gregory Case is a 67 y.o. male who is being seen today in office for blood pressure/hypertension with reading today 128/78.  Pt states he feels well and denies any headaches dizziness chest pain. Continues to take prinivil 10 mg po daily.    F/u chol /lipids w pt currently on Zocor 40 mg q hs along w fish oil. Pt denies any SE or issues w med. F/u labs needed.    F/u elevated glucose w/o med use. Pt tries to watch sugar in diet but does not check BS outpt. Last A1C stable. No increased urination thirst noted.    F/U chronic anxiety/depresion w pt on prozac 10 mg daily as well as clonazepam 1mg  prn. Pt states he is doing well at this time and denies any panic attacks or increased anxiety/depression sx.     Follow-up chronic back pain/DJD/OA for which patient has been on long-term narcotic use for multiple years tolerated ;hx of TKR noted in past and most recently started having increased left lower back pain w radiculopathy of left leg. Was seen at [facility] and received injection so states pain is some better today.    Encounter Date: 02/05/2023  Note Received: 02/06/23 1043  Orders   Spine Injection: L5-S1     Post-Procedure Diagnoses   Lumbar disc herniation with radiculopathy             Procedures:    ILESI L5-S1      Performed by: Bethena Roys, MD  Authorized by: Bethena Roys, MD          Consent Given by:  Patient  Site marked: the procedure site was marked    Timeout: prior to procedure the correct patient, procedure, and site was verified    Preparation:  Skin prepped with Hibiclens  Procedure:  ILESI  Indications:  Therapeutic benefit  Guidance:  Fluoroscopy  Levels:  L5-S1  Site(s):     Pt once again w arthritic pains in the hands knees and sometimes between dosing of Norco he uses Duexis or motrin. Request  medication refills  today and no abuse history noted and patient compliant with urine drug screen.    Colonoscopy up-to-date; will be due again in 2026     flu shot up to date    PHQ Questionnaire  Little interest or pleasure in doing things.: Not at all  Feeling down, depressed, or hopeless: Not at all  PHQ 2 Total: 0  Trouble falling or staying asleep, or sleeping too much.: Not at all  Feeling tired or having little energy: Not at all  Poor appetite or overeating: Not at all  Feeling bad about yourself/ that you are a failure in the past 2 weeks?: Not at all  Trouble concentrating on things in the past 2 weeks?: Not at all  Moving/Speaking slowly or being fidgety or restless  in the past 2 weeks?: Not at all  Thoughts that you would be better off DEAD, or of hurting yourself in some way.: Not at all  If you checked off any problems, how difficult have these problems made it for you to do your work, take care of things at home, or get along with other people?: Not difficult  at all  PHQ 9 Total: 0  Interpretation of Total Score: 0-4 No depression      Past Medical History:   Diagnosis Date    Abnormal glucose level     Anxiety     Chronic back pain     COVID-19 vaccine series completed     Depression     Mixed hyperlipidemia     Osteoarthritis          Past Surgical History:   Procedure Laterality Date    COLONOSCOPY W/ POLYPECTOMY      HX BACK SURGERY      HX CARPAL TUNNEL RELEASE      HX KNEE REPLACMENT Right     HX TONSILLECTOMY        Family Medical History:       Problem Relation (Age of Onset)    Coronary Artery Disease Mother    Diabetes type II Mother    Hypertension (High Blood Pressure) Other            Social History     Tobacco Use    Smoking status: Never    Smokeless tobacco: Never   Substance Use Topics    Alcohol use: Yes     Comment: few times a week    Drug use: Never     Medication:  gabapentin (NEURONTIN) 300 mg Oral Capsule, Take 1 Capsule (300 mg total) by mouth Every  night  HYDROcodone-acetaminophen (NORCO) 7.5-325 mg Oral Tablet, Take 1 Tablet by mouth Three times a day as needed for Pain for up to 30 days  omega 3-dha-epa-fish oil 300 mg (120 mg- 180mg )-1,000 mg Oral Capsule, Take 1 Capsule by mouth Once a day  ondansetron (ZOFRAN ODT) 8 mg Oral Tablet, Rapid Dissolve, Place 1 Tablet (8 mg total) under the tongue Every 8 hours as needed for Nausea/Vomiting  tiZANidine (ZANAFLEX) 4 mg Oral Tablet, Take 1 Tablet (4 mg total) by mouth Three times a day as needed  clonazePAM (KLONOPIN) 1 mg Oral Tablet, Take 1 Tablet (1 mg total) by mouth Every night  FLUoxetine (PROZAC) 10 mg Oral Capsule, Take 1 Capsule (10 mg total) by mouth Once a day  lisinopriL (PRINIVIL) 10 mg Oral Tablet, Take 1 Tablet (10 mg total) by mouth Once a day  simvastatin (ZOCOR) 40 mg Oral Tablet, Take 1 Tablet (40 mg total) by mouth Every night    No facility-administered medications prior to visit.    Allergies:  Allergies   Allergen Reactions    Meloxicam Hives/ Urticaria       Physical Exam:  Vitals:    02/06/23 1039   BP: 128/78   Pulse: 83   SpO2: 96%   Weight: 76.2 kg (168 lb)   Height: 1.651 m (5\' 5" )   BMI: 27.96          Vitals reviewed  General appearance: alert, cooperative, in no acute distress  HEENT: Ears: clear, no effusion, no erythema; Throat: non-erythematous; no LAD, neck supple, moist mucus membranes  Lungs: clear to auscultation bilaterally; no wheezes or rhonchi   Heart: regular rate and rhythm; normal S1 & S2; no murmur  Abdomen: soft, non-tender, not distended; bowel sounds present  Extremities: extremities normal ROM, +arthritic changes bilat hands/knees w hx of previous RT TKR - scar noted  +straight leg raise left leg at 85 degrees  Gait stable  no cyanosis or edema , no rash  Psych: alert and oriented x 3  Neuro: CN 2-12 grossly intact  Assessment & Plan  Essential hypertension  Stable on current meds  Mixed hyperlipidemia  On statin therapy  Labs obtained today  Abnormal glucose  level  Continue watching diet  Aic obtained today  Anxiety  Stable on klonopin  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  Chronic low back pain, unspecified back pain laterality, unspecified whether sciatica present  On chronic pain meds  Continue Norco 7.5mg  tid - refills given  [facility]  Osteoarthritis of multiple joints, unspecified osteoarthritis type    Benign prostatic hyperplasia, unspecified whether lower urinary tract symptoms present  PSA obtained today  Vitamin D deficiency    Long-term use of high-risk medication  Uds obtained     Orders Placed This Encounter    DRUG SCREEN, NO CONFIRMATION, URINE    CBC/DIFF    COMPREHENSIVE METABOLIC PNL, FASTING    HGA1C (HEMOGLOBIN A1C WITH EST AVG GLUCOSE)    URINALYSIS, MACROSCOPIC AND MICROSCOPIC W/CULTURE REFLEX    LIPID PANEL    MICROALBUMIN/CREATININE RATIO, URINE, RANDOM    THYROID STIMULATING HORMONE WITH FREE T4 REFLEX    VITAMIN B12    IRON TRANSFERRIN AND TIBC    PSA, DIAGNOSTIC    CBC WITH DIFF    URINALYSIS, MACROSCOPIC    URINALYSIS, MICROSCOPIC    clonazePAM (KLONOPIN) 1 mg Oral Tablet    FLUoxetine (PROZAC) 10 mg Oral Capsule    lisinopriL (PRINIVIL) 10 mg Oral Tablet    simvastatin (ZOCOR) 40 mg Oral Tablet       Post-Discharge Follow Up Appointments       Tuesday Mar 06, 2023    Return Patient Visit with Eyvonne Mechanic, PA-C at  8:30 AM      Tuesday Apr 03, 2023    Return Patient Visit with Eyvonne Mechanic, PA-C at  8:30 AM      Thursday May 31, 2023    Return Telephone Visit with Eyvonne Mechanic, PA-C at  1:00 PM      Internal Medicine, Building [facility]  [facility]  [address]  [place] [address]  [phone]              Seek medical attention for new or worsening symptoms.  Patient has been seen in this clinic within the last 3 years.     Lowella Kindley, PA-C          This note was partially created using MModal Fluency Direct system (voice recognition software) and is inherently subject to errors including those of syntax and  "sound-alike" substitutions which may escape proofreading.  In such instances, original meaning may be extrapolated by contextual derivation.

## 2023-02-06 NOTE — Nursing Note (Signed)
Patient is in the office today as a routine 1 month follow up for medication refill.

## 2023-02-06 NOTE — Assessment & Plan Note (Signed)
Continue watching diet  Aic obtained today

## 2023-02-06 NOTE — Assessment & Plan Note (Signed)
On statin therapy  Labs obtained today

## 2023-02-07 LAB — HGA1C (HEMOGLOBIN A1C WITH EST AVG GLUCOSE): HEMOGLOBIN A1C: 6.2 % — ABNORMAL HIGH (ref 4.0–6.0)

## 2023-02-08 MED ORDER — HYDROCODONE 7.5 MG-ACETAMINOPHEN 325 MG TABLET
1.0000 | ORAL_TABLET | Freq: Three times a day (TID) | ORAL | 0 refills | Status: AC | PRN
Start: 2023-02-08 — End: 2023-03-10

## 2023-02-09 LAB — LAB: COLOGUARD RESULT: POSITIVE — AB

## 2023-02-09 LAB — COLOGUARD® COLON CANCER SCREEN: COLOGUARD RESULT: POSITIVE — AB

## 2023-02-15 ENCOUNTER — Other Ambulatory Visit (INDEPENDENT_AMBULATORY_CARE_PROVIDER_SITE_OTHER): Payer: Self-pay

## 2023-03-06 ENCOUNTER — Other Ambulatory Visit: Payer: Self-pay

## 2023-03-06 ENCOUNTER — Ambulatory Visit: Payer: BC Managed Care – PPO | Attending: Physician Assistant | Admitting: Physician Assistant

## 2023-03-06 ENCOUNTER — Encounter (INDEPENDENT_AMBULATORY_CARE_PROVIDER_SITE_OTHER): Payer: Self-pay | Admitting: Physician Assistant

## 2023-03-06 VITALS — BP 119/70 | HR 77 | Ht 65.0 in | Wt 169.2 lb

## 2023-03-06 DIAGNOSIS — G8929 Other chronic pain: Secondary | ICD-10-CM | POA: Insufficient documentation

## 2023-03-06 DIAGNOSIS — R7989 Other specified abnormal findings of blood chemistry: Secondary | ICD-10-CM | POA: Insufficient documentation

## 2023-03-06 DIAGNOSIS — R7303 Prediabetes: Secondary | ICD-10-CM | POA: Insufficient documentation

## 2023-03-06 DIAGNOSIS — D72829 Elevated white blood cell count, unspecified: Secondary | ICD-10-CM | POA: Insufficient documentation

## 2023-03-06 DIAGNOSIS — M545 Low back pain, unspecified: Secondary | ICD-10-CM | POA: Insufficient documentation

## 2023-03-06 DIAGNOSIS — I10 Essential (primary) hypertension: Secondary | ICD-10-CM | POA: Insufficient documentation

## 2023-03-06 DIAGNOSIS — F419 Anxiety disorder, unspecified: Secondary | ICD-10-CM | POA: Insufficient documentation

## 2023-03-06 LAB — BASIC METABOLIC PANEL
ANION GAP: 6 mmol/L (ref 4–13)
BUN/CREA RATIO: 11 (ref 6–22)
BUN: 11 mg/dL (ref 7–25)
CALCIUM: 9.1 mg/dL (ref 8.6–10.3)
CHLORIDE: 104 mmol/L (ref 98–107)
CO2 TOTAL: 29 mmol/L (ref 21–31)
CREATININE: 0.96 mg/dL (ref 0.60–1.30)
ESTIMATED GFR: 86 mL/min/{1.73_m2} (ref >59)
GLUCOSE: 90 mg/dL (ref 74–109)
OSMOLALITY, CALCULATED: 277 mosm/kg (ref 270–290)
POTASSIUM: 4.1 mmol/L (ref 3.5–5.1)
SODIUM: 139 mmol/L (ref 136–145)

## 2023-03-06 LAB — CBC WITH DIFF
BASOPHIL #: 0.1 10*3/uL (ref 0.00–0.10)
BASOPHIL %: 1 % (ref 0–1)
EOSINOPHIL #: 0.1 10*3/uL (ref 0.00–0.50)
EOSINOPHIL %: 1 % (ref 1–8)
HCT: 41.6 % (ref 36.7–47.1)
HGB: 14 g/dL (ref 12.5–16.3)
LYMPHOCYTE #: 2.9 10*3/uL (ref 1.00–3.00)
LYMPHOCYTE %: 38 % (ref 16–44)
MCH: 29.1 pg (ref 23.8–33.4)
MCHC: 33.6 g/dL (ref 32.5–36.3)
MCV: 86.7 fL (ref 73.0–96.2)
MONOCYTE #: 1 10*3/uL (ref 0.30–1.00)
MONOCYTE %: 14 % — ABNORMAL HIGH (ref 5–13)
MPV: 8.7 fL (ref 7.4–11.4)
NEUTROPHIL #: 3.5 10*3/uL (ref 1.85–7.80)
NEUTROPHIL %: 46 % (ref 43–77)
PLATELETS: 219 10*3/uL (ref 140–440)
RBC: 4.8 10*6/uL (ref 4.06–5.63)
RDW: 14.6 % (ref 12.1–16.2)
WBC: 7.5 10*3/uL (ref 3.6–10.2)

## 2023-03-06 LAB — THYROID STIMULATING HORMONE WITH FREE T4 REFLEX: TSH: 0.725 u[IU]/mL (ref 0.450–5.330)

## 2023-03-06 MED ORDER — CLONAZEPAM 1 MG TABLET
1.0000 mg | ORAL_TABLET | Freq: Every evening | ORAL | 2 refills | Status: DC
Start: 2023-03-06 — End: 2023-04-10

## 2023-03-06 NOTE — Assessment & Plan Note (Signed)
Stable on current meds 

## 2023-03-06 NOTE — Assessment & Plan Note (Signed)
 Continue strict low sugar carb diet  Pt refused glucometer at this time

## 2023-03-06 NOTE — Progress Notes (Signed)
[facility]  [address]  [place] [address]          Follow Up        Reason for Visit: Follow Up (1 month cdm) labs    History of Present Illness  Gregory Case is a 67 y.o. male who is being seen today in office for f/u as well as repeat labs after last labs showed his A1C up to 6.2 wbc elevated to 25.6 and TSH low at 0.404.  Pt is currently not on BS med but states he has started watching his diet a lot closer so thinks BS will be down. He does not have a meter to check BS at home and states if will not check it if given one. Concerning his WBC pt had received a steriod injection into his back a day prior to lab so poss related. He is currently not on thyroid med and states he has never had issues w his thyroid prior so will recheck TSH level today.    F/u blood pressure/hypertension with reading today 119/70.  Pt states he feels well and denies any headaches dizziness chest pain. Continues to take prinivil 10 mg po daily.    F/U chronic anxiety/depresion w pt on prozac 10 mg daily as well as clonazepam 1mg  prn. Pt states he is doing well at this time and denies any panic attacks or increased anxiety/depression sx. Needs clonazepam refilled.    Follow-up chronic back pain/DJD/OA for which patient has been on long-term narcotic use for multiple years tolerated ;hx of TKR noted in past and most recently started having increased left lower back pain w radiculopathy of left leg. Was seen at [facility] and received injection so states pain is some better today.    Encounter Date: 02/05/2023  Note Received: 02/06/23 1043  Orders   Spine Injection: L5-S1     Post-Procedure Diagnoses   Lumbar disc herniation with radiculopathy             Procedures:    ILESI L5-S1      Performed by: Bethena Roys, MD  Authorized by: Bethena Roys, MD          Consent Given by:  Patient  Site marked: the procedure site was marked    Timeout: prior to procedure the correct patient, procedure,  and site was verified    Preparation:  Skin prepped with Hibiclens  Procedure:  ILESI  Indications:  Therapeutic benefit  Guidance:  Fluoroscopy  Levels:  L5-S1  Site(s):     Pt once again w arthritic pains in the hands knees and sometimes between dosing of Norco he uses Duexis or motrin. Request medication refills  today and no abuse history noted and patient compliant with urine drug screen.    Colonoscopy up-to-date; will be due again in 2026     flu shot up to date    PHQ Questionnaire         Past Medical History:   Diagnosis Date    Abnormal glucose level     Anxiety     Chronic back pain     COVID-19 vaccine series completed     Depression     Mixed hyperlipidemia     Osteoarthritis          Past Surgical History:   Procedure Laterality Date    COLONOSCOPY W/ POLYPECTOMY      HX BACK SURGERY      HX CARPAL TUNNEL RELEASE  HX KNEE REPLACMENT Right     HX TONSILLECTOMY        Family Medical History:       Problem Relation (Age of Onset)    Coronary Artery Disease Mother    Diabetes type II Mother    Hypertension (High Blood Pressure) Other            Social History     Tobacco Use    Smoking status: Never    Smokeless tobacco: Never   Substance Use Topics    Alcohol use: Yes     Comment: few times a week    Drug use: Never     Medication:  FLUoxetine (PROZAC) 10 mg Oral Capsule, Take 1 Capsule (10 mg total) by mouth Once a day  HYDROcodone-acetaminophen (NORCO) 7.5-325 mg Oral Tablet, Take 1 Tablet by mouth Three times a day as needed for Pain for up to 30 days  lisinopriL (PRINIVIL) 10 mg Oral Tablet, Take 1 Tablet (10 mg total) by mouth Once a day  omega 3-dha-epa-fish oil 300 mg (120 mg- 180mg )-1,000 mg Oral Capsule, Take 1 Capsule by mouth Once a day  ondansetron (ZOFRAN ODT) 8 mg Oral Tablet, Rapid Dissolve, Place 1 Tablet (8 mg total) under the tongue Every 8 hours as needed for Nausea/Vomiting (Patient not taking: Reported on 03/06/2023)  simvastatin (ZOCOR) 40 mg Oral Tablet, Take 1 Tablet (40 mg  total) by mouth Every night  tiZANidine (ZANAFLEX) 4 mg Oral Tablet, Take 1 Tablet (4 mg total) by mouth Three times a day as needed  clonazePAM (KLONOPIN) 1 mg Oral Tablet, Take 1 Tablet (1 mg total) by mouth Every night  gabapentin (NEURONTIN) 300 mg Oral Capsule, Take 1 Capsule (300 mg total) by mouth Every night    No facility-administered medications prior to visit.    Allergies:  Allergies   Allergen Reactions    Meloxicam Hives/ Urticaria       Physical Exam:  Vitals:    03/06/23 0905   BP: 119/70   Pulse: 77   SpO2: 98%   Weight: 76.7 kg (169 lb 3.2 oz)   Height: 1.651 m (5\' 5" )   BMI: 28.16          Vitals reviewed  General appearance: alert, cooperative, in no acute distress  HEENT: Ears: clear, no effusion, no erythema; Throat: non-erythematous; no LAD, neck supple, moist mucus membranes  Lungs: clear to auscultation bilaterally; no wheezes or rhonchi   Heart: regular rate and rhythm; normal S1 & S2; no murmur  Abdomen: soft, non-tender, not distended; bowel sounds present  Extremities: extremities normal ROM, +arthritic changes bilat hands/knees w hx of previous RT TKR - scar noted  +straight leg raise left leg at 85 degrees  Gait stable  no cyanosis or edema , no rash  Psych: alert and oriented x 3  Neuro: CN 2-12 grossly intact  Assessment & Plan  Prediabetes  Continue strict low sugar carb diet  Pt refused glucometer at this time   Leukocytosis, unspecified type  Repeat cbc obtained  Low TSH level  Repeat TSH obtained  Essential hypertension  Stable on current meds  Anxiety  Continue clonazepam- refill given  Chronic low back pain, unspecified back pain laterality, unspecified whether sciatica present  On chronic pain meds  Continue Norco 7.5mg  tid prn- refill given     Orders Placed This Encounter    THYROID STIMULATING HORMONE WITH FREE T4 REFLEX    CBC/DIFF    BASIC METABOLIC PANEL  CBC WITH DIFF    clonazePAM (KLONOPIN) 1 mg Oral Tablet     Post-Discharge Follow Up Appointments       Tuesday Apr 03, 2023    Return Patient Visit with Eyvonne Mechanic, PA-C at  8:30 AM      Wednesday May 02, 2023    Return Patient Visit with Eyvonne Mechanic, PA-C at  9:00 AM      Wednesday May 30, 2023    Return Patient Visit with Eyvonne Mechanic, PA-C at  9:30 AM      Thursday May 31, 2023    Return Telephone Visit with Eyvonne Mechanic, PA-C at  1:00 PM      Thursday Jun 28, 2023    Return Patient Visit with Eyvonne Mechanic, PA-C at  9:00 AM      [facility]  [facility]  [address]  [place] [address]  [phone]              Seek medical attention for new or worsening symptoms.  Patient has been seen in this clinic within the last 3 years.     Jaylnn Ullery, PA-C          This note was partially created using MModal Fluency Direct system (voice recognition software) and is inherently subject to errors including those of syntax and "sound-alike" substitutions which may escape proofreading.  In such instances, original meaning may be extrapolated by contextual derivation.

## 2023-03-06 NOTE — Assessment & Plan Note (Signed)
Continue clonazepam- refill given

## 2023-03-06 NOTE — Assessment & Plan Note (Signed)
On chronic pain meds  Continue Norco 7.5mg  tid prn- refill given

## 2023-03-06 NOTE — Nursing Note (Signed)
Patient is here for 1 month cdm.

## 2023-03-12 ENCOUNTER — Other Ambulatory Visit (RURAL_HEALTH_CENTER): Payer: Self-pay | Admitting: Family Medicine

## 2023-03-12 MED ORDER — HYDROCODONE 7.5 MG-ACETAMINOPHEN 325 MG TABLET
1.0000 | ORAL_TABLET | Freq: Three times a day (TID) | ORAL | 0 refills | Status: DC
Start: 2023-03-12 — End: 2023-04-10

## 2023-03-12 NOTE — Progress Notes (Signed)
Pt had an appt with PCP 12/31. Still benefits from pain medication. PDMP reviewed. Rx sent for the next month for Norco 7.5mg  tid #90 to [place]

## 2023-03-13 ENCOUNTER — Other Ambulatory Visit (RURAL_HEALTH_CENTER): Payer: Self-pay | Admitting: Family Medicine

## 2023-03-13 MED ORDER — HYDROCODONE 7.5 MG-ACETAMINOPHEN 325 MG TABLET
1.0000 | ORAL_TABLET | Freq: Three times a day (TID) | ORAL | 0 refills | Status: DC | PRN
Start: 2023-05-12 — End: 2023-06-15

## 2023-03-13 MED ORDER — HYDROCODONE 7.5 MG-ACETAMINOPHEN 325 MG TABLET
1.0000 | ORAL_TABLET | Freq: Three times a day (TID) | ORAL | 0 refills | Status: AC | PRN
Start: 2023-03-13 — End: 2023-04-11

## 2023-03-13 MED ORDER — HYDROCODONE 7.5 MG-ACETAMINOPHEN 325 MG TABLET
1.0000 | ORAL_TABLET | Freq: Three times a day (TID) | ORAL | 0 refills | Status: AC | PRN
Start: 2023-04-12 — End: 2023-05-11

## 2023-04-03 ENCOUNTER — Ambulatory Visit (INDEPENDENT_AMBULATORY_CARE_PROVIDER_SITE_OTHER): Payer: Self-pay | Admitting: Physician Assistant

## 2023-04-10 ENCOUNTER — Ambulatory Visit: Payer: BC Managed Care – PPO | Attending: Physician Assistant | Admitting: Physician Assistant

## 2023-04-10 ENCOUNTER — Encounter (INDEPENDENT_AMBULATORY_CARE_PROVIDER_SITE_OTHER): Payer: Self-pay | Admitting: Physician Assistant

## 2023-04-10 ENCOUNTER — Other Ambulatory Visit: Payer: Self-pay

## 2023-04-10 VITALS — BP 102/74 | HR 73 | Ht 65.0 in | Wt 170.0 lb

## 2023-04-10 DIAGNOSIS — G8929 Other chronic pain: Secondary | ICD-10-CM | POA: Insufficient documentation

## 2023-04-10 DIAGNOSIS — F1090 Alcohol use, unspecified, uncomplicated: Secondary | ICD-10-CM

## 2023-04-10 DIAGNOSIS — F419 Anxiety disorder, unspecified: Secondary | ICD-10-CM | POA: Insufficient documentation

## 2023-04-10 DIAGNOSIS — M545 Low back pain, unspecified: Secondary | ICD-10-CM | POA: Insufficient documentation

## 2023-04-10 DIAGNOSIS — I10 Essential (primary) hypertension: Secondary | ICD-10-CM | POA: Insufficient documentation

## 2023-04-10 DIAGNOSIS — F32A Depression, unspecified: Secondary | ICD-10-CM | POA: Insufficient documentation

## 2023-04-10 DIAGNOSIS — R7303 Prediabetes: Secondary | ICD-10-CM | POA: Insufficient documentation

## 2023-04-10 MED ORDER — FLUOXETINE 10 MG CAPSULE
10.0000 mg | ORAL_CAPSULE | Freq: Every day | ORAL | 1 refills | Status: DC
Start: 2023-04-10 — End: 2023-05-09

## 2023-04-10 MED ORDER — CLONAZEPAM 1 MG TABLET
1.0000 mg | ORAL_TABLET | Freq: Every evening | ORAL | 2 refills | Status: DC
Start: 2023-04-10 — End: 2023-05-09

## 2023-04-10 MED ORDER — SIMVASTATIN 40 MG TABLET
40.0000 mg | ORAL_TABLET | Freq: Every evening | ORAL | 1 refills | Status: DC
Start: 2023-04-10 — End: 2023-05-21

## 2023-04-10 MED ORDER — LISINOPRIL 10 MG TABLET
10.0000 mg | ORAL_TABLET | Freq: Every day | ORAL | 1 refills | Status: DC
Start: 2023-04-10 — End: 2023-06-06

## 2023-04-10 NOTE — Nursing Note (Signed)
Patient is here for routine 1 month follow up for medication refill.

## 2023-04-10 NOTE — Assessment & Plan Note (Signed)
Controlled on current meds  Continue low na diet/wt management

## 2023-04-10 NOTE — Assessment & Plan Note (Signed)
Stable on klonopin 1mg - refills given

## 2023-04-10 NOTE — Assessment & Plan Note (Signed)
Continue Prozac 10 mg qd

## 2023-04-10 NOTE — Assessment & Plan Note (Signed)
A1C now 6.2  Glucometer samples given  Continue diet /wt loss efforts  To check sugar couple times a week or if feeling like elevated  Will get labs next month

## 2023-04-10 NOTE — Assessment & Plan Note (Signed)
 On chronic pain meds  Continue Norco 7.5mg  q 8 hrs - refills given

## 2023-04-10 NOTE — Progress Notes (Signed)
[facility]  [address]  [place] [address]          Follow Up        Reason for Visit: Follow Up (Routine 1 month ) labs    History of Present Illness  Gregory Case is a 68 y.o. male who is being seen today in office for blood pressure/hypertension with reading today 128/78.  Pt states he feels well and denies any headaches dizziness chest pain. Continues to take prinivil 10 mg po daily.    F/u elevated glucose/prediabetes w/o med use. Pt tries to watch sugar in diet but does not check BS outpt currently. Last A1C 6.2 up from 5.6. No increased urination thirst noted.    F/U chronic anxiety/depresion w pt on prozac 10 mg daily as well as clonazepam 1mg  prn. Pt states he is doing well at this time and denies any panic attacks or increased anxiety/depression sx.     Follow-up chronic back pain/DJD/OA for which patient has been on long-term narcotic use for multiple years tolerated ;hx of TKR noted in past and most recently started having increased left lower back pain w radiculopathy of left leg. [facility] with pain much better since. Ortho eval noted:    Encounter Date: 02/05/2023  Note Received: 02/06/23 1043  Orders   Spine Injection: L5-S1     Post-Procedure Diagnoses   Lumbar disc herniation with radiculopathy             Procedures:    ILESI L5-S1      Performed by: Bethena Roys, MD  Authorized by: Bethena Roys, MD          Consent Given by:  Patient  Site marked: the procedure site was marked    Timeout: prior to procedure the correct patient, procedure, and site was verified    Preparation:  Skin prepped with Hibiclens  Procedure:  ILESI  Indications:  Therapeutic benefit  Guidance:  Fluoroscopy  Levels:  L5-S1  Site(s):     Pt once again w arthritic pains in the hands knees and sometimes between dosing of Norco he uses Duexis or motrin. Request medication refills  today and no abuse history noted and patient compliant  with urine drug screen.    Colonoscopy up-to-date; will be due again in 2026     flu shot up to date    PHQ Questionnaire  Little interest or pleasure in doing things.: Not at all  Feeling down, depressed, or hopeless: Not at all  PHQ 2 Total: 0  Trouble falling or staying asleep, or sleeping too much.: Not at all  Feeling tired or having little energy: Not at all  Poor appetite or overeating: Not at all  Feeling bad about yourself/ that you are a failure in the past 2 weeks?: Not at all  Trouble concentrating on things in the past 2 weeks?: Not at all  Moving/Speaking slowly or being fidgety or restless  in the past 2 weeks?: Not at all  Thoughts that you would be better off DEAD, or of hurting yourself in some way.: Not at all  If you checked off any problems, how difficult have these problems made it for you to do your work, take care of things at home, or get along with other people?: Not difficult at all  PHQ 9 Total: 0  Interpretation of Total Score: 0-4 No depression      Past Medical History:  Diagnosis Date    Abnormal glucose level     Anxiety     Chronic back pain     COVID-19 vaccine series completed     Depression     Mixed hyperlipidemia     Osteoarthritis          Past Surgical History:   Procedure Laterality Date    COLONOSCOPY W/ POLYPECTOMY      HX BACK SURGERY      HX CARPAL TUNNEL RELEASE      HX KNEE REPLACMENT Right     HX TONSILLECTOMY        Family Medical History:       Problem Relation (Age of Onset)    Coronary Artery Disease Mother    Diabetes type II Mother    Hypertension (High Blood Pressure) Other            Social History     Tobacco Use    Smoking status: Never    Smokeless tobacco: Never   Substance Use Topics    Alcohol use: Yes     Comment: few times a week    Drug use: Never     Medication:  HYDROcodone-acetaminophen (NORCO) 7.5-325 mg Oral Tablet, Take 1 Tablet by mouth Every 8 hours as needed for Pain for up to 29 days  [START ON 04/12/2023] HYDROcodone-acetaminophen (NORCO)  7.5-325 mg Oral Tablet, Take 1 Tablet by mouth Every 8 hours as needed for Pain for up to 29 days  [START ON 05/12/2023] HYDROcodone-acetaminophen (NORCO) 7.5-325 mg Oral Tablet, Take 1 Tablet by mouth Every 8 hours as needed for Pain for up to 29 days  omega 3-dha-epa-fish oil 300 mg (120 mg- 180mg )-1,000 mg Oral Capsule, Take 1 Capsule by mouth Once a day  ondansetron (ZOFRAN ODT) 8 mg Oral Tablet, Rapid Dissolve, Place 1 Tablet (8 mg total) under the tongue Every 8 hours as needed for Nausea/Vomiting (Patient not taking: Reported on 03/06/2023)  tiZANidine (ZANAFLEX) 4 mg Oral Tablet, Take 1 Tablet (4 mg total) by mouth Three times a day as needed  clonazePAM (KLONOPIN) 1 mg Oral Tablet, Take 1 Tablet (1 mg total) by mouth Every night  FLUoxetine (PROZAC) 10 mg Oral Capsule, Take 1 Capsule (10 mg total) by mouth Once a day  lisinopriL (PRINIVIL) 10 mg Oral Tablet, Take 1 Tablet (10 mg total) by mouth Once a day  simvastatin (ZOCOR) 40 mg Oral Tablet, Take 1 Tablet (40 mg total) by mouth Every night    No facility-administered medications prior to visit.    Allergies:  Allergies   Allergen Reactions    Meloxicam Hives/ Urticaria       Physical Exam:  Vitals:    04/10/23 0803   BP: 102/74   Pulse: 73   SpO2: 97%   Weight: 77.1 kg (170 lb)   Height: 1.651 m (5\' 5" )   BMI: 28.29          Vitals reviewed  General appearance: alert, cooperative, in no acute distress  HEENT: Ears: clear, no effusion, no erythema; Throat: non-erythematous; no LAD, neck supple, moist mucus membranes  Lungs: clear to auscultation bilaterally; no wheezes or rhonchi   Heart: regular rate and rhythm; normal S1 & S2; no murmur  Abdomen: soft, non-tender, not distended; bowel sounds present  Extremities: extremities normal ROM, +arthritic changes bilat hands/knees w hx of previous RT TKR - scar noted  +straight leg raise left leg at 85 degrees  Gait stable  no cyanosis or edema ,  no rash  Psych: alert and oriented x 3  Neuro: CN 2-12 grossly  intact    Glucose in office this am 92  Assessment & Plan  Essential hypertension  Controlled on current meds  Continue low na diet/wt management  Prediabetes  A1C now 6.2  Glucometer samples given  Continue diet /wt loss efforts  To check sugar couple times a week or if feeling like elevated  Will get labs next month  Anxiety  Stable on klonopin 1mg - refills given  Depression, unspecified depression type  Continue Prozac 10 mg qd  Chronic low back pain, unspecified back pain laterality, unspecified whether sciatica present  On chronic pain meds  Continue Norco 7.5mg  q 8 hrs- refills given     Orders Placed This Encounter    POCT Blood Glucose/Fingerstick (AMB)    FLUoxetine (PROZAC) 10 mg Oral Capsule    clonazePAM (KLONOPIN) 1 mg Oral Tablet    simvastatin (ZOCOR) 40 mg Oral Tablet    lisinopriL (PRINIVIL) 10 mg Oral Tablet         Post-Discharge Follow Up Appointments       Wednesday May 09, 2023    Return Patient Visit with Eyvonne Mechanic, PA-C at  8:30 AM      Thursday May 31, 2023    Return Telephone Visit with Eyvonne Mechanic, PA-C at  1:00 PM      Wednesday Jun 06, 2023    Return Patient Visit with Eyvonne Mechanic, PA-C at  8:45 AM      Wednesday Jul 04, 2023    Return Patient Visit with Eyvonne Mechanic, PA-C at  8:00 AM      Internal Medicine, [facility]  [facility]  [address]  [place] [address]  [phone]         Seek medical attention for new or worsening symptoms.  Patient has been seen in this clinic within the last 3 years.     Sharlena Kristensen, PA-C          This note was partially created using MModal Fluency Direct system (voice recognition software) and is inherently subject to errors including those of syntax and "sound-alike" substitutions which may escape proofreading.  In such instances, original meaning may be extrapolated by contextual derivation.

## 2023-04-10 NOTE — Nursing Note (Signed)
04/10/23 0900   Glucose  (Fingerstick)   Time Performed 0903   Glucose (Ref Range 74-106 mg/dl) 92   Initials KD, CMA

## 2023-05-02 ENCOUNTER — Ambulatory Visit (INDEPENDENT_AMBULATORY_CARE_PROVIDER_SITE_OTHER): Payer: Self-pay | Admitting: Physician Assistant

## 2023-05-09 ENCOUNTER — Other Ambulatory Visit: Payer: Self-pay

## 2023-05-09 ENCOUNTER — Other Ambulatory Visit (INDEPENDENT_AMBULATORY_CARE_PROVIDER_SITE_OTHER): Payer: Self-pay | Admitting: Physician Assistant

## 2023-05-09 ENCOUNTER — Ambulatory Visit: Payer: BC Managed Care – PPO | Attending: Physician Assistant | Admitting: Physician Assistant

## 2023-05-09 ENCOUNTER — Encounter (INDEPENDENT_AMBULATORY_CARE_PROVIDER_SITE_OTHER): Payer: Self-pay | Admitting: Physician Assistant

## 2023-05-09 VITALS — BP 124/84 | HR 69 | Ht 65.0 in | Wt 175.0 lb

## 2023-05-09 DIAGNOSIS — I10 Essential (primary) hypertension: Secondary | ICD-10-CM | POA: Insufficient documentation

## 2023-05-09 DIAGNOSIS — M159 Polyosteoarthritis, unspecified: Secondary | ICD-10-CM | POA: Insufficient documentation

## 2023-05-09 DIAGNOSIS — M545 Low back pain, unspecified: Secondary | ICD-10-CM | POA: Insufficient documentation

## 2023-05-09 DIAGNOSIS — G8929 Other chronic pain: Secondary | ICD-10-CM | POA: Insufficient documentation

## 2023-05-09 DIAGNOSIS — F419 Anxiety disorder, unspecified: Secondary | ICD-10-CM | POA: Insufficient documentation

## 2023-05-09 DIAGNOSIS — E782 Mixed hyperlipidemia: Secondary | ICD-10-CM | POA: Insufficient documentation

## 2023-05-09 DIAGNOSIS — R7303 Prediabetes: Secondary | ICD-10-CM | POA: Insufficient documentation

## 2023-05-09 MED ORDER — TIZANIDINE 4 MG TABLET
4.0000 mg | ORAL_TABLET | Freq: Three times a day (TID) | ORAL | 0 refills | Status: DC | PRN
Start: 2023-05-09 — End: 2023-12-05

## 2023-05-09 MED ORDER — BENZONATATE 100 MG CAPSULE
100.0000 mg | ORAL_CAPSULE | Freq: Three times a day (TID) | ORAL | 0 refills | Status: AC
Start: 2023-05-09 — End: 2023-05-19

## 2023-05-09 MED ORDER — CLONAZEPAM 1 MG TABLET
1.0000 mg | ORAL_TABLET | Freq: Every evening | ORAL | 2 refills | Status: DC
Start: 2023-05-09 — End: 2023-08-08

## 2023-05-09 MED ORDER — FLUOXETINE 10 MG CAPSULE
10.0000 mg | ORAL_CAPSULE | Freq: Every day | ORAL | 1 refills | Status: DC
Start: 2023-05-09 — End: 2023-10-11

## 2023-05-09 NOTE — Assessment & Plan Note (Signed)
 On chronic pain management  Continue Norco 7.5 mg every 8 hours p.r.n.-refills given

## 2023-05-09 NOTE — Progress Notes (Signed)
 [facility]  [address]  [place] [address]          Follow Up        Reason for Visit: Follow Up (Routine 1 month ) med refills    History of Present Illness  Gregory Case is a 68 y.o. male who is being seen today in office for blood pressure/hypertension with reading today 124/84.  Pt states he feels well and denies any headaches dizziness chest pain. Continues to take prinivil 10 mg po daily.    F/u elevated glucose/prediabetes w/o med use. Pt tries to watch sugar in diet but does not check BS outpt currently. Last A1C 6.2 up from 5.6. No increased urination thirst noted.  Will obtain labs in repeat next visit.    Follow-up cholesterol/lipids with last cholesterol 231 LDL 138.  Patient is still on Zocor 40 mg nightly and states he is trying to watch his diet a little bit better and get out and exercise as well.  No side effects of medication noted will get labs in follow-up next month.    F/U chronic anxiety/depresion w pt on prozac 10 mg daily as well as clonazepam 1mg  prn. Pt states he is doing well at this time and denies any panic attacks or increased anxiety/depression sx.     Follow-up chronic back pain/DJD/OA for which patient has been on long-term narcotic use for multiple years tolerated ;hx of TKR noted in past and most recently started having increased left lower back pain w radiculopathy of left leg. [facility] with pain much better since. Ortho eval noted:    Encounter Date: 02/05/2023  Note Received: 02/06/23 1043  Orders   Spine Injection: L5-S1     Post-Procedure Diagnoses   Lumbar disc herniation with radiculopathy             Procedures:    ILESI L5-S1      Performed by: Bethena Roys, MD  Authorized by: Bethena Roys, MD          Consent Given by:  Patient  Site marked: the procedure site was marked    Timeout: prior to procedure the correct patient, procedure, and site was verified    Preparation:  Skin  prepped with Hibiclens  Procedure:  ILESI  Indications:  Therapeutic benefit  Guidance:  Fluoroscopy  Levels:  L5-S1  Site(s):     Pt once again w arthritic pains in the hands knees and sometimes between dosing of Norco he uses Duexis or motrin. Request medication refills  today and no abuse history noted and patient compliant with urine drug screen.      Colonoscopy up-to-date; will be due again in 2026     flu shot up to date    PHQ Questionnaire         Past Medical History:   Diagnosis Date    Abnormal glucose level     Anxiety     Chronic back pain     COVID-19 vaccine series completed     Depression     Mixed hyperlipidemia     Osteoarthritis          Past Surgical History:   Procedure Laterality Date    COLONOSCOPY W/ POLYPECTOMY      HX BACK SURGERY      HX CARPAL TUNNEL RELEASE      HX KNEE REPLACMENT Right     HX TONSILLECTOMY  Family Medical History:       Problem Relation (Age of Onset)    Coronary Artery Disease Mother    Diabetes type II Mother    Hypertension (High Blood Pressure) Other            Social History     Tobacco Use    Smoking status: Never    Smokeless tobacco: Never   Substance Use Topics    Alcohol use: Yes     Comment: few times a week    Drug use: Never     Medication:  HYDROcodone-acetaminophen (NORCO) 7.5-325 mg Oral Tablet, Take 1 Tablet by mouth Every 8 hours as needed for Pain for up to 29 days  [START ON 05/12/2023] HYDROcodone-acetaminophen (NORCO) 7.5-325 mg Oral Tablet, Take 1 Tablet by mouth Every 8 hours as needed for Pain for up to 29 days  lisinopriL (PRINIVIL) 10 mg Oral Tablet, Take 1 Tablet (10 mg total) by mouth Once a day  simvastatin (ZOCOR) 40 mg Oral Tablet, Take 1 Tablet (40 mg total) by mouth Every night  clonazePAM (KLONOPIN) 1 mg Oral Tablet, Take 1 Tablet (1 mg total) by mouth Every night  FLUoxetine (PROZAC) 10 mg Oral Capsule, Take 1 Capsule (10 mg total) by mouth Once a day  omega 3-dha-epa-fish oil 300 mg (120 mg- 180mg )-1,000 mg Oral Capsule, Take 1  Capsule by mouth Once a day  ondansetron (ZOFRAN ODT) 8 mg Oral Tablet, Rapid Dissolve, Place 1 Tablet (8 mg total) under the tongue Every 8 hours as needed for Nausea/Vomiting (Patient not taking: Reported on 03/06/2023)  tiZANidine (ZANAFLEX) 4 mg Oral Tablet, Take 1 Tablet (4 mg total) by mouth Three times a day as needed    No facility-administered medications prior to visit.    Allergies:  Allergies   Allergen Reactions    Meloxicam Hives/ Urticaria       Physical Exam:  Vitals:    05/09/23 0856   BP: 124/84   Pulse: 69   SpO2: 97%   Weight: 79.4 kg (175 lb)   Height: 1.651 m (5\' 5" )   BMI: 29.12          Vitals reviewed  General appearance: alert, cooperative, in no acute distress  HEENT: Ears: clear, no effusion, no erythema; Throat: non-erythematous; no LAD, neck supple, moist mucus membranes  Lungs: clear to auscultation bilaterally; no wheezes or rhonchi   Heart: regular rate and rhythm; normal S1 & S2; no murmur  Abdomen: soft, non-tender, not distended; bowel sounds present  Extremities: extremities normal ROM, +arthritic changes bilat hands/knees w hx of previous RT TKR - scar noted  +straight leg raise left leg at 85 degrees  Gait stable  no cyanosis or edema , no rash  Psych: alert and oriented x 3  Neuro: CN 2-12 grossly intact  Assessment & Plan  Essential hypertension  Blood pressure stable on current meds  Continue monitoring blood pressure outpatient  Prediabetes  Continue diet along with weight management  Last A1c 6.2 Will follow-up with labs next month visit  Mixed hyperlipidemia  Continue diet weight management along with Zocor  Follow-up labs needed next month  Anxiety  Continue Klonopin 1 mg nightly p.r.n. along with Prozac 10 mg daily-refills given  Chronic low back pain, unspecified back pain laterality, unspecified whether sciatica present  On chronic pain management  Continue Norco 7.5 mg every 8 hours p.r.n.-refills given  Osteoarthritis of multiple joints, unspecified osteoarthritis  type       Orders  Placed This Encounter    clonazePAM (KLONOPIN) 1 mg Oral Tablet    FLUoxetine (PROZAC) 10 mg Oral Capsule    tiZANidine (ZANAFLEX) 4 mg Oral Tablet    benzonatate (TESSALON) 100 mg Oral Capsule         Post-Discharge Follow Up Appointments       Thursday May 31, 2023    Return Telephone Visit with Eyvonne Mechanic, PA-C at  1:00 PM      Wednesday Jun 06, 2023    Return Patient Visit with Eyvonne Mechanic, PA-C at  8:45 AM      Wednesday Jul 04, 2023    Return Patient Visit with Eyvonne Mechanic, PA-C at  8:00 AM      Wednesday Aug 01, 2023    Return Patient Visit with Eyvonne Mechanic, PA-C at  8:15 AM      Wednesday Aug 29, 2023    Return Patient Visit with Eyvonne Mechanic, PA-C at  8:30 AM      [facility] Medicine, [facility]  [facility]  [address]  [place] [address]  [phone]            Seek medical attention for new or worsening symptoms.  Patient has been seen in this clinic within the last 3 years.     Amy Hyman Bible, PA-C        I personally reviewed the documentation of care provided by the certified physician assistant and I agree with her medical decision making, Weyman Pedro, D.O.     This note was partially created using MModal Fluency Direct system (voice recognition software) and is inherently subject to errors including those of syntax and "sound-alike" substitutions which may escape proofreading.  In such instances, original meaning may be extrapolated by contextual derivation.

## 2023-05-09 NOTE — Assessment & Plan Note (Signed)
 Continue diet weight management along with Zocor  Follow-up labs needed next month

## 2023-05-09 NOTE — Assessment & Plan Note (Signed)
 Continue diet along with weight management  Last A1c 6.2 Will follow-up with labs next month visit

## 2023-05-09 NOTE — Assessment & Plan Note (Signed)
 Blood pressure stable on current meds  Continue monitoring blood pressure outpatient

## 2023-05-09 NOTE — Assessment & Plan Note (Signed)
 Continue Klonopin 1 mg nightly p.r.n. along with Prozac 10 mg daily-refills given

## 2023-05-09 NOTE — Telephone Encounter (Signed)
 1 month follow up for medication refills.

## 2023-05-09 NOTE — Nursing Note (Signed)
Patient is here for routine 1 month follow up for medication refill.

## 2023-05-21 ENCOUNTER — Other Ambulatory Visit: Payer: Self-pay

## 2023-05-21 ENCOUNTER — Encounter (INDEPENDENT_AMBULATORY_CARE_PROVIDER_SITE_OTHER): Payer: Self-pay | Admitting: Physician Assistant

## 2023-05-21 ENCOUNTER — Ambulatory Visit: Attending: Physician Assistant | Admitting: Physician Assistant

## 2023-05-21 VITALS — BP 120/68 | HR 77 | Ht 65.0 in | Wt 174.4 lb

## 2023-05-21 DIAGNOSIS — R7989 Other specified abnormal findings of blood chemistry: Secondary | ICD-10-CM | POA: Insufficient documentation

## 2023-05-21 DIAGNOSIS — M10071 Idiopathic gout, right ankle and foot: Secondary | ICD-10-CM | POA: Insufficient documentation

## 2023-05-21 DIAGNOSIS — E782 Mixed hyperlipidemia: Secondary | ICD-10-CM | POA: Insufficient documentation

## 2023-05-21 DIAGNOSIS — R7303 Prediabetes: Secondary | ICD-10-CM

## 2023-05-21 DIAGNOSIS — M109 Gout, unspecified: Secondary | ICD-10-CM | POA: Insufficient documentation

## 2023-05-21 LAB — COMPREHENSIVE METABOLIC PNL, FASTING
ALBUMIN/GLOBULIN RATIO: 1.5 — ABNORMAL HIGH (ref 0.8–1.4)
ALBUMIN: 4.4 g/dL (ref 3.5–5.7)
ALKALINE PHOSPHATASE: 34 U/L (ref 34–104)
ALT (SGPT): 15 U/L (ref 7–52)
ANION GAP: 7 mmol/L (ref 4–13)
AST (SGOT): 22 U/L (ref 13–39)
BILIRUBIN TOTAL: 0.5 mg/dL (ref 0.3–1.0)
BUN/CREA RATIO: 12 (ref 6–22)
BUN: 10 mg/dL (ref 7–25)
CALCIUM, CORRECTED: 8.8 mg/dL — ABNORMAL LOW (ref 8.9–10.8)
CALCIUM: 9.1 mg/dL (ref 8.6–10.3)
CHLORIDE: 104 mmol/L (ref 98–107)
CO2 TOTAL: 27 mmol/L (ref 21–31)
CREATININE: 0.85 mg/dL (ref 0.60–1.30)
ESTIMATED GFR: 95 mL/min/{1.73_m2} (ref >59)
GLOBULIN: 3 (ref 2.0–3.5)
GLUCOSE: 89 mg/dL (ref 74–109)
OSMOLALITY, CALCULATED: 274 mosm/kg (ref 270–290)
POTASSIUM: 4.1 mmol/L (ref 3.5–5.1)
PROTEIN TOTAL: 7.4 g/dL (ref 6.4–8.9)
SODIUM: 138 mmol/L (ref 136–145)

## 2023-05-21 LAB — CBC WITH DIFF
BASOPHIL #: 0 10*3/uL (ref 0.00–0.10)
BASOPHIL %: 1 % (ref 0–1)
EOSINOPHIL #: 0.1 10*3/uL (ref 0.00–0.50)
EOSINOPHIL %: 1 % (ref 1–8)
HCT: 41.6 % (ref 36.7–47.1)
HGB: 13.9 g/dL (ref 12.5–16.3)
LYMPHOCYTE #: 2.7 10*3/uL (ref 1.00–3.20)
LYMPHOCYTE %: 26 % (ref 15–43)
MCH: 29.2 pg (ref 23.8–33.4)
MCHC: 33.4 g/dL (ref 32.5–36.3)
MCV: 87.5 fL (ref 73.0–96.2)
MONOCYTE #: 1.1 10*3/uL (ref 0.30–1.10)
MONOCYTE %: 11 % (ref 6–14)
MPV: 9.7 fL (ref 7.4–11.4)
NEUTROPHIL #: 6.5 10*3/uL (ref 1.70–7.60)
NEUTROPHIL %: 62 % (ref 44–74)
PLATELETS: 189 10*3/uL (ref 140–440)
RBC: 4.76 10*6/uL (ref 4.06–5.63)
RDW: 13 % (ref 12.1–16.2)
WBC: 10.4 10*3/uL — ABNORMAL HIGH (ref 3.6–10.2)

## 2023-05-21 LAB — LIPID PANEL
CHOL/HDL RATIO: 3.1
CHOLESTEROL: 157 mg/dL (ref <200)
HDL CHOL: 50 mg/dL (ref >=40)
LDL CALC: 88 mg/dL (ref 0–100)
TRIGLYCERIDES: 96 mg/dL (ref <=150)
VLDL CALC: 19 mg/dL (ref 0–50)

## 2023-05-21 LAB — THYROID STIMULATING HORMONE WITH FREE T4 REFLEX: TSH: 0.796 u[IU]/mL (ref 0.450–5.330)

## 2023-05-21 LAB — HGA1C (HEMOGLOBIN A1C WITH EST AVG GLUCOSE): HEMOGLOBIN A1C: 5.6 % (ref 4.0–6.0)

## 2023-05-21 LAB — URIC ACID: URIC ACID: 5.7 mg/dL (ref 2.3–7.6)

## 2023-05-21 MED ORDER — TRIAMCINOLONE ACETONIDE 40 MG/ML SUSPENSION FOR INJECTION
40.0000 mg | INTRAMUSCULAR | Status: AC
Start: 2023-05-21 — End: 2023-05-21
  Administered 2023-05-21: 40 mg via INTRAMUSCULAR

## 2023-05-21 MED ORDER — SIMVASTATIN 40 MG TABLET
40.0000 mg | ORAL_TABLET | Freq: Every evening | ORAL | 1 refills | Status: DC
Start: 2023-05-21 — End: 2023-10-11

## 2023-05-21 MED ORDER — COLCHICINE 0.6 MG TABLET
0.6000 mg | ORAL_TABLET | Freq: Every day | ORAL | 1 refills | Status: DC
Start: 2023-05-21 — End: 2023-11-09

## 2023-05-21 NOTE — Assessment & Plan Note (Signed)
Labs obtained in office

## 2023-05-21 NOTE — Progress Notes (Signed)
 [facility]  [address]  [place] [address]  Operated by [facility]  Progress Note    Name: Gregory Case MRN:  [id number]   Date: 05/21/2023 DOB:  09-May-1955 (67 y.o.)              Chief Complaint: Feel Sick (Possible gout in right ankle.)       HPI: Gregory Case is a 68 y.o. male who complains of  RT ankle pain w redness warmth pain x 3 days. Pt concerned could be gout due to  eating red meat prior to sx started. Pt denies any ETOH use. No injury noted.  Pt also needed labs for his TSH and BS so would like to get today as well if he has to check uric acid as well.    Allergies:  Allergies   Allergen Reactions    Meloxicam Hives/ Urticaria       Current Medications:  clonazePAM (KLONOPIN) 1 mg Oral Tablet, Take 1 Tablet (1 mg total) by mouth Every night  FLUoxetine (PROZAC) 10 mg Oral Capsule, Take 1 Capsule (10 mg total) by mouth Once a day  HYDROcodone-acetaminophen (NORCO) 7.5-325 mg Oral Tablet, Take 1 Tablet by mouth Every 8 hours as needed for Pain for up to 29 days  lisinopriL (PRINIVIL) 10 mg Oral Tablet, Take 1 Tablet (10 mg total) by mouth Once a day  tiZANidine (ZANAFLEX) 4 mg Oral Tablet, Take 1 Tablet (4 mg total) by mouth Three times a day as needed (Patient not taking: Reported on 05/21/2023)  simvastatin (ZOCOR) 40 mg Oral Tablet, Take 1 Tablet (40 mg total) by mouth Every night    No facility-administered medications prior to visit.       Past Medical History:   Diagnosis Date    Abnormal glucose level     Anxiety     Chronic back pain     COVID-19 vaccine series completed     Depression     Mixed hyperlipidemia     Osteoarthritis            Social History     Tobacco Use    Smoking status: Never    Smokeless tobacco: Never   Substance Use Topics    Alcohol use: Yes     Comment: few times a week    Drug use: Never       OBJECTIVE:  Vitals:    05/21/23 1254   BP: 120/68   Pulse: 77   SpO2: 97%   Weight: 79.1 kg (174 lb 6.4 oz)   Height: 1.651  m (5\' 5" )   BMI: 29.02      Physical Exam  Vitals and nursing note reviewed.   Constitutional:       Appearance: Normal appearance.   Musculoskeletal:         General: No swelling (RT ankle increased on lateral side w redness warmth on palp) or tenderness. Normal range of motion.      Comments: Gait stable   Skin:     Findings: Erythema present. No rash.   Neurological:      General: No focal deficit present.      Mental Status: He is alert and oriented to person, place, and time.   Psychiatric:         Mood and Affect: Mood normal.         Behavior: Behavior normal.         Thought Content: Thought content normal.  Judgment: Judgment normal.        Assessment & Plan  Gout attack  Suspected  Kenalog injection given  Colchicine 0.6mg  daily x 1 week   Low uric acid diet encouraged   Prediabetes  Labs obtained in office  Abnormal TSH    Mixed hyperlipidemia         PLAN: Treatment per orders . Call or return to clinic prn if these symptoms worsen or fail to improve as anticipated.  Orders Placed This Encounter    URIC ACID    CBC/DIFF    COMPREHENSIVE METABOLIC PNL, FASTING    HGA1C (HEMOGLOBIN A1C WITH EST AVG GLUCOSE)    URINALYSIS, MACROSCOPIC AND MICROSCOPIC W/CULTURE REFLEX    LIPID PANEL    MICROALBUMIN/CREATININE RATIO, URINE, RANDOM    THYROID STIMULATING HORMONE WITH FREE T4 REFLEX    CBC WITH DIFF    simvastatin (ZOCOR) 40 mg Oral Tablet    triamcinolone acetonide (KENALOG-40) 40 mg/mL injection    colchicine 0.6 mg Oral Tablet      Post-Discharge Follow Up Appointments       Thursday May 31, 2023    Return Telephone Visit with Eyvonne Mechanic, PA-C at  1:00 PM      Wednesday Jun 06, 2023    Return Patient Visit with Eyvonne Mechanic, PA-C at  8:45 AM      Wednesday Jul 04, 2023    Return Patient Visit with Eyvonne Mechanic, PA-C at  8:00 AM      Wednesday Aug 01, 2023    Return Patient Visit with Eyvonne Mechanic, PA-C at  8:15 AM      Wednesday Aug 29, 2023    Return Patient Visit with Eyvonne Mechanic, PA-C at  8:30 AM       [facility]  [facility], [place]  [address]  [place] [address]  [phone]             This note was partially created using MModal Fluency Direct system (voice recognition software) and is inherently subject to errors including those of syntax and "sound-alike" substitutions which may escape proofreading.  In such instances, original meaning may be extrapolated by contextual derivation.    Giles Currie, PA-C

## 2023-05-21 NOTE — Nursing Note (Signed)
 Patient is here for possible gout in right ankle. Swollen, red, warm to touch  x 3 days.

## 2023-05-25 ENCOUNTER — Telehealth (INDEPENDENT_AMBULATORY_CARE_PROVIDER_SITE_OTHER): Payer: Self-pay | Admitting: Physician Assistant

## 2023-05-25 ENCOUNTER — Ambulatory Visit (INDEPENDENT_AMBULATORY_CARE_PROVIDER_SITE_OTHER): Payer: Self-pay

## 2023-05-25 DIAGNOSIS — D72829 Elevated white blood cell count, unspecified: Secondary | ICD-10-CM

## 2023-05-30 ENCOUNTER — Ambulatory Visit (INDEPENDENT_AMBULATORY_CARE_PROVIDER_SITE_OTHER): Payer: Self-pay | Admitting: Physician Assistant

## 2023-05-31 ENCOUNTER — Other Ambulatory Visit: Payer: Self-pay

## 2023-05-31 ENCOUNTER — Ambulatory Visit: Payer: BC Managed Care – PPO | Attending: Physician Assistant | Admitting: Physician Assistant

## 2023-05-31 ENCOUNTER — Encounter (INDEPENDENT_AMBULATORY_CARE_PROVIDER_SITE_OTHER): Payer: Self-pay | Admitting: Physician Assistant

## 2023-05-31 DIAGNOSIS — Z0000 Encounter for general adult medical examination without abnormal findings: Secondary | ICD-10-CM

## 2023-05-31 NOTE — Nursing Note (Signed)
 Patient has now completed the Medicare Wellness Exam.

## 2023-05-31 NOTE — Patient Instructions (Signed)
 Medicare Preventive Services  Medicare coverage information Recommendation for YOU   Heart Disease and Diabetes   Lipid profile every 5 years or more often if at risk for cardiovascular disease  Lab Results   Component Value Date    CHOLESTEROL 157 05/21/2023    HDLCHOL 50 05/21/2023    LDLCHOL 88 05/21/2023    TRIG 96 05/21/2023       Diabetes Screening with Blood Glucose test or Glucose Tolerance Test Yearly for those at risk for diabetes, up to two tests per year for those with prediabetes  Last Glucose: 89     Diabetes Self-Management Training   Initial training ten hours per year, and follow-up training two hours per subsequent year. Optional for those with diabetes    Medical Nutrition Therapy   Three hours of one-on-one counseling in first year, two hours in subsequent years. Optional for those with diabetes, kidney disease   Intensive Behavioral Therapy for Obesity  Face-to-face counseling, first month every week, month 2-6 every other week, month 7-12 every month if continued progress is documented Optional for those with Body Mass Index 30 or higher  Your There is no height or weight on file to calculate BMI.   Tobacco Cessation (Quitting) Counseling   Two attempts per year, max 4 sessions per attempt, up to 8 sessions per year Optional for those who use tobacco    Cancer Screening Last Completion Date   Colorectal screening   For anyone age 13 to 104 or any age if high risk:  Screening Colonoscopy every 10 yrs if low risk,  more frequent if higher risk  OR  Cologuard Stool DNA test once every 3 years OR  Fecal Occult Blood Testing yearly OR  Flexible  Sigmoidoscopy  every 5 yr OR  CT Colonography every 5 yrs  --06/30/2019  See below for due date if applicable.   Prostate Cancer Screening  Prostate Specific Antigen blood test based on joint decision making with your provider for ages 76-69  A joint decision between you and your primary care provider   Lung Cancer Screening  Annual low dose computed  tomography (LDCT scan) is recommended for those age 81-80 who smoked 20 pack-years and are current smokers or quit smoking within past 15 years, after counseling by your doctor or nurse clinician about the possible benefits or harms.   See below for due date if applicable.   Vaccinations   Respiratory syncytial virus (RSV)  Age 90 years or older: Based on shared clinical decision-making with your provider.  Pneumococcal Vaccine  Recommended routinely age 66+ with one or two separate vaccines based on your risk. Recommended before age 102 if medical conditions with increased risk  Seasonal Influenza Vaccine  Once every flu season   Hepatitis B Vaccine  3 doses if risk (including anyone with diabetes or liver disease)  Shingles Vaccine  Two doses at age 78 or older  Diphtheria Tetanus Pertussis Vaccine  ONCE as adult, booster every 10 years     Immunization History   Administered Date(s) Administered   . Covid-19 Vaccine,Moderna Bivalent 12/11/2019   . Covid-19 Vaccine,Moderna,12 Years+ 12/11/2019   . Covid-19 Vaccine,Pfizer-BioNTech,Purple Top,23yrs+ 04/04/2019, 05/02/2019   . HEPATITIS A VACCINE -ADULT 07/16/2013, 03/18/2014   . Influenza Vaccine, 6 month-adult 12/16/2018, 12/10/2019, 12/22/2020, 12/12/2021, 12/11/2022   . MEASLES/MUMPS/RUBELLA VIRUS VACCINE 08/07/2013   . TYPHOID VACCINE INJECTION (ADMIN) 07/16/2013   . Tetanus,Diptheria,Pertussis(BOOSTRIX) 07/16/2013   . Typhoid Vaccine Oral-2 BL unit cap (ADMIN) 07/16/2013  Shingles vaccine and Diphtheria Tetanus Pertussis vaccines are available at pharmacies or local health department without a prescription.   Other Preventative Screening  Last Completion Date   Glaucoma Screening   Yearly if in high risk group such as diabetes, family history, African American age 68+ or Hispanic American age 23+ See your Eye Care Provider   Hepatitis C Screening   Recommended  for those born between ages 18-79 years.   See below for due date if applicable.     HIV  Testing  Recommended routinely at least ONCE, covered every year for age 7 to 31 regardless of risk, and every year for age over 26 who ask for the test or higher risk. Yearly or up to 3 times in pregnancy    See below for due date if applicable.  Bone Densitometry   Screening is recommended for Men ages 31 and above with one or more risk factor: androgen deprivation therapy for prostate cancer, hypogonadism, frailty, primary hyperparathyroidism, hyperthyroidism  For men diagnosed with osteoporosis, follow up is recommended every years or a frequency recommended by your provider     See below for due date if applicable.   Abdominal Aortic Aneurysm Screening Ultrasound   Once with a family history of abdominal aortic aneurysms OR a male between65-75 and have smoked at least 100 cigarettes in your lifetime.     See below for due date if applicable.       Your Personalized Schedule for Preventive Tests   Health Maintenance: Pending and Last Completed       Date Due Completion Date    NonMedicare Preventative Exam Never done ---    Adult Tdap-Td (2 - Td or Tdap) 07/17/2023 07/16/2013    Prostate Cancer Screening 02/05/2025 02/06/2023    Colonoscopy 06/29/2029 06/30/2019           Non-Opioid Treatment for Chronic Pains   Treatment for chronic pain can be managed without opioids. Below are non-opioid options that may be considered and discussed with your provider to determine which options would be best for your health.    Over the counter or presciptions medications:  Acetaminophen  (Tylenol ) or Non-steroidal anti-inflammatories such as: Ibuprofen (Motrin, Advil), naproxen (Aleve), aspirin  Antidepressants such as amitriptyline, nortriptyline (Pamelor),  Doxepin (Silenor), Imipramine (Tofranil) and others.  Anticonvulsant Nerve pain medications: Gabapentin  (Neurontin ), pregabalin (Lyrica)  Externally applied medications such as NSAID'S, lidocaine, capsaisin, and others  Injections: pain specialists can sometimes inject  medications at the site of pain.    Alternative therapies such as  Acupuncture  Osteopathic manipulation  Chiropractic  Massage therapy       For Information on Advanced Directives for Health Care:  [place]:  [email or web address]  [place], [place], [place], [place] General Information: [email or web address]

## 2023-05-31 NOTE — Progress Notes (Signed)
 INTERNAL MEDICINE, BUILDING A  [address]  [place] [address]  Operated by [facility]  Medicare Annual Wellness Visit    Name: Gregory Case  MRN:  [id number]  Date: 05/31/2023  Age: 68 y.o.      SUBJECTIVE:   Gregory Case is a 68 y.o. male for presenting for Medicare Wellness exam.   I have reviewed and reconciled the medication list with the patient today.    TELEMEDICINE DOCUMENTATION:    [facility]    Patient/family aware of provider location:  yes  Patient/family consent for telemedicine:  yes  Examination observed and performed by:  Cloyce Darby, PA-C         05/31/2023     1:29 PM 05/30/2022     1:42 PM   Comprehensive Health Assessment-Adult   Do you wish to complete this form? Yes Yes   During the past 4 weeks, how would you rate your health in general? Very Good Very Good   During the past 4 weeks, how much difficulty have you had doing your usual activities inside and outside your home because of medical or emotional problems? No difficulty at all A little bit of difficulty   During the past 4 weeks, was someone available to help you if you needed and wanted help? Yes, as much as I wanted Yes, as much as I wanted   In the past year, how many times have you gone to the emergency department or been admitted to a hospital for a health problem? None None   Are you generally satisfied with your sleep? Yes Yes   Do you have enough money to buy things you need in everyday life, such as food, clothing, medicines, and housing? Yes, always Yes, always   Can you get to places beyond walking distance without help?  (For example, can you drive your own car or travel alone on buses)? Yes Yes   Do you fasten your seatbelt when you are in a car? Yes, usually Yes, usually   Do you exercise 20 minutes 3 or more days per week (such as walking, dancing, biking, mowing grass, swimming)? Yes, most of the time Yes, most of the time   How often do you eat food that is healthy  (fruits, vegetables, lean meats) instead of unhealthy (sweets, fast food, junk food, fatty foods)? Most of the time Most of the time   Have your parents, brothers or sisters had any of the following problems before the age of 73? (check all that apply)  Diabetes (sugar);High cholesterol;Alcohol or drug addiction (or abuse)   How often do you have trouble taking medicines the eay you are told to take them? I always take them as prescribed I always take them as prescribed   Do you need any help communicating with your doctors and nurses because of vision or hearing problems? No No   During the past 12 months, have you experienced confusion or memory loss that is happening more often or is getting worse? No No   Do you have one person you think of as your personal doctor (primary care provider or family doctor)? Yes Yes   If you are seeing a Primary Care Provider (PCP) or family doctor. please list their name Yusuke Beza Dailan Pfalzgraf   Are you now also seeing any specialist physician(s) (such as eye doctor, foot doctor, skin doctor)? No No   How confident are you that you can control or manage most of  your health problems? Very confident Very confident       I have reviewed and updated as appropriate the past medical, family and social history. 05/31/2023 as summarized below:  Past Medical History:   Diagnosis Date    Abnormal glucose level     Anxiety     Chronic back pain     COVID-19 vaccine series completed     Depression     Mixed hyperlipidemia     Osteoarthritis      Past Surgical History:   Procedure Laterality Date    Colonoscopy w/ polypectomy      Hx back surgery      Hx carpal tunnel release      Hx knee replacment Right     Hx tonsillectomy       Current Outpatient Medications   Medication Sig    clonazePAM  (KLONOPIN ) 1 mg Oral Tablet Take 1 Tablet (1 mg total) by mouth Every night    colchicine  0.6 mg Oral Tablet Take 1 Tablet (0.6 mg total) by mouth Daily    FLUoxetine  (PROZAC ) 10 mg Oral Capsule Take 1  Capsule (10 mg total) by mouth Once a day    HYDROcodone -acetaminophen  (NORCO) 7.5-325 mg Oral Tablet Take 1 Tablet by mouth Every 8 hours as needed for Pain for up to 29 days    lisinopriL  (PRINIVIL ) 10 mg Oral Tablet Take 1 Tablet (10 mg total) by mouth Once a day    simvastatin  (ZOCOR ) 40 mg Oral Tablet Take 1 Tablet (40 mg total) by mouth Every night    tiZANidine  (ZANAFLEX ) 4 mg Oral Tablet Take 1 Tablet (4 mg total) by mouth Three times a day as needed (Patient not taking: Reported on 05/21/2023)     Family Medical History:       Problem Relation (Age of Onset)    Coronary Artery Disease Mother    Diabetes type II Mother    Hypertension (High Blood Pressure) Other            Social History     Socioeconomic History    Marital status: Married   Tobacco Use    Smoking status: Never    Smokeless tobacco: Never   Substance and Sexual Activity    Alcohol use: Yes     Comment: few times a week    Drug use: Never    Sexual activity: Yes     Partners: Female     Social Determinants of Health     Health Literacy: Low Risk  (12/11/2022)    Health Literacy     SDOH Health Literacy: Never         List of Current Health Care Providers  Health Maintenance   Topic Date Due    NonMedicare Preventative Exam  Never done    Adult Tdap-Td (2 - Td or Tdap) 07/17/2023    Prostate Cancer Screening  02/05/2025    Colonoscopy  06/29/2029    Influenza Vaccine  Completed    Hepatitis C screening  Discontinued    Shingles Vaccine  Discontinued    Covid-19 Vaccine  Discontinued    Pneumococcal Vaccination, Age 51+  Discontinued     Medicare Wellness Assessment      Medicare initial or wellness physical in the last year?: Yes  Advance Directives  Does patient have a living will or MPOA: No        Advance directive information given to the patient today?: Patient Declined  Activities of Daily Living  Do  you need help with dressing, bathing, or walking?: No  Do you need help with shopping, housekeeping, medications, or finances?: No  Do you  have rugs in hallways, broken steps, or poor lighting?: No  Do you have grab bars in your bathroom, non-slip strips in your tub, and hand rails on your stairs?: Yes  Cognitive Function Screen (1=Yes, 0=No)  What is you age?: Correct  What is the time to the nearest hour?: Correct  What is the year?: Correct  What is the name of this clinic?: Correct  Can the patient recognize two persons (the doctor, the nurse, home help, etc.)?: Correct  What is the date of your birth? (day and month sufficient) : Correct  In what year did World War II end?: Incorrect  Who is the current president of the [place] ?: Correct  Count from 20 down to 1?: Correct  What address did I give you earlier?: Correct  Total Score: 9  Interpretation of Total Score: Greater than 6 Normal  Fall Risk Screen  Do you feel unsteady when standing or walking?: No  Do you worry about falling?: No  Have you fallen in the past year?: No        Urine Incontinence Screen   Urinary Incontinence Screen  Do you ever leak urine when you don't want to?: No                      OBJECTIVE:   There were no vitals taken for this visit.       Other appropriate exam:    Health Maintenance Due   Topic Date Due    NonMedicare Preventative Exam  Never done      ASSESSMENT & PLAN:   Assessment/Plan   1. Medicare annual wellness visit, subsequent       Identified Risk Factors/ Recommended Actions  The PHQ 2 Total: 0 depression screen is interpreted as negative.      Opioid use plan of care:         Opioids use: Plan: Assessment of pain completed and pain controlled  Continue current dose of Norco    Patient declined Advanced Directives information.        No orders of the defined types were placed in this encounter.    The patient has been educated about risk factors and recommended preventive care. Written Prevention Plan completed/ updated and given to patient (see After Visit Summary).    Post-Discharge Follow Up Appointments       Wednesday Jun 06, 2023    Return  Patient Visit with Cloyce Darby, PA-C at  8:45 AM      Wednesday Jul 04, 2023    Return Patient Visit with Cloyce Darby, PA-C at  8:00 AM      Wednesday Aug 01, 2023    Return Patient Visit with Cloyce Darby, PA-C at  8:15 AM      Wednesday Aug 29, 2023    Return Patient Visit with Cloyce Darby, PA-C at  8:30 AM      Monday Jun 02, 2024    Return Telephone Visit with Cloyce Darby, PA-C at  2:00 PM      Internal Medicine, Building A  Building [facility]  [address]  [place] [address]  [phone]           Medicare Preventive Services  Medicare coverage information Recommendation for YOU   Heart Disease and Diabetes   Lipid profile Every 5  years or more often if at risk for cardiovascular disease  Last Lipid Panel  (Last result in the past 2 years)        Cholesterol   HDL   LDL   Direct LDL   Triglycerides      05/21/23 1330 157   50   88     96             Diabetes Screening  yearly for those at risk for diabetes, 2 tests per year for those with prediabetes Last Glucose: 89    Diabetes Self Management Training or Medical Nutrition Therapy  For those with diabetes, up to 10 hrs initial training within a year, subsequent years up to 2 hrs of follow up training Optional for those with diabetes     Medical Nutrition Therapy Three hours of one-on-one counseling in first year, two hours in subsequent years Optional for those with diabetes, kidney disease   Intensive Behavioral Therapy for Obesity  Face-to-face counseling, first month every week, month 2-6 every other week, month 7-12 every month if continued progress is documented Optional for those with Body Mass Index 30 or higher  Your There is no height or weight on file to calculate BMI.   Tobacco Cessation (Quitting) Counseling   Two attempts per year, max 4 sessions per attempt, up to 8 per year, for those with tobacco-related health condition Optional for those that use tobacco   Cancer Screening   Colorectal screening   For anyone age 29 to 80 or any age  if high risk:  Screening Colonoscopy every 10 yrs if low risk,  more frequent if higher risk  OR  Cologuard Stool DNA test once every 3 years OR  Fecal Occult Blood Testing yearly OR  Flexible  Sigmoidoscopy  every 5 yr OR  CT Colonography every 5 yrs    See your schedule below   Screening Pap Test Recommended every 3 years for all women age 63 to 73, or every five years if combined with HPV test (routine screening not needed after total hysterectomy).  Medicare covers every 2 years, up to yearly if high risk.  Screening Pelvic Exam Medicare covers every 2 years, yearly if high risk or childbearing age with abnormal Pap in last 3 yrs. See your schedule below   Screening Mammogram   Recommended every 2 years for women age 28 to 66, or more frequent if you have a higher risk. Selectively recommended for women between 40-49 based on shared decisions about risk. Covered by Medicare up to every year for women age 83 or older See your schedule below      Lung Cancer Screening  Annual low dose computed tomography (LDCT scan) is recommended for those age 9-77 who smoked 20 pack-years and are current smokers or quit smoking within past 15 years (one pack-year= smoking one PPD for one year), after counseling by your doctor or nurse clinician about the possible benefits or harms. See your schedule below   Vaccinations   Pneumococcal Vaccine: Recommended routinely age 29+ with one or two separate vaccines based on your risk    Recommended before age 33 if medical conditions with increased risk  Seasonal Influenza Vaccine: Once every flu season   Hepatitis B Vaccine: 3 doses if risk (including anyone with diabetes or liver disease)  Shingles Vaccine: Two doses at age 21 or older  Diphtheria Tetanus Pertussis Vaccine: ONCE as adult, booster every 10 years     Immunization History  Administered Date(s) Administered    Covid-19 Vaccine,Moderna Bivalent 12/11/2019    Covid-19 Vaccine,Moderna,12 Years+ 12/11/2019    Covid-19  Vaccine,Pfizer-BioNTech,Purple Top,61yrs+ 04/04/2019, 05/02/2019    HEPATITIS A VACCINE -ADULT 07/16/2013, 03/18/2014    Influenza Vaccine, 6 month-adult 12/16/2018, 12/10/2019, 12/22/2020, 12/12/2021, 12/11/2022    MEASLES/MUMPS/RUBELLA VIRUS VACCINE 08/07/2013    TYPHOID VACCINE INJECTION (ADMIN) 07/16/2013    Tetanus,Diptheria,Pertussis(BOOSTRIX) 07/16/2013    Typhoid Vaccine Oral-2 BL unit cap (ADMIN) 07/16/2013     Shingles vaccine and Diphtheria Tetanus Pertussis vaccines are available at pharmacies or local health department without a prescription.   Other Screening   Bone Densitometry   Every 24 months for anyone at risk, including postmenopausal       Glaucoma Screening   Yearly if in high risk group such as diabetes, family history, African American age 54+ or Hispanic American age 36+   See your Eye Care Provider   Hepatitis C Screening recommended ONCE for those born between 1945-1965, or high risk for HCV infection       HIV Testing recommended routinely at least ONCE, covered every year for age 33 to 59 regardless of risk, and every year for age over 8 who ask for the test or higher risk  Yearly or up to 3 times in pregnancy         Abdominal Aortic Aneurysm Screening Ultrasound   Once between the age of 37-75 with a family history of AAA       Your Personalized Schedule for Preventive Tests     Health Maintenance: Pending and Last Completed         Date Due Completion Date    NonMedicare Preventative Exam Never done ---    Adult Tdap-Td (2 - Td or Tdap) 07/17/2023 07/16/2013    Prostate Cancer Screening 02/05/2025 02/06/2023    Colonoscopy 06/29/2029 06/30/2019               Non-Opioid Treatment for Chronic Pains     Treatment for chronic pain can be managed without opioids. Below are non-opioid options that may be considered and discussed with your provider to determine which options would be best for your health.    Over the counter or presciptions medications:  Acetaminophen  (Tylenol ) or  Non-steroidal anti-inflammatories such as: Ibuprofen (Motrin, Advil), naproxen (Aleve), aspirin  Antidepressants such as amitriptyline, nortriptyline (Pamelor),  Doxepin (Silenor), Imipramine (Tofranil) and others.  Anticonvulsant Nerve pain medications: Gabapentin  (Neurontin ), pregabalin (Lyrica)  Externally applied medications such as NSAID'S, lidocaine, capsaisin, and others  Injections: pain specialists can sometimes inject medications at the site of pain.    Alternative therapies such as  Acupuncture  Osteopathic manipulation  Chiropractic  Massage therapy     Total time on visit 30 minutes    Davyon Fisch B. [place] PA-C

## 2023-06-06 ENCOUNTER — Ambulatory Visit: Payer: Self-pay | Attending: Physician Assistant | Admitting: Physician Assistant

## 2023-06-06 ENCOUNTER — Other Ambulatory Visit (INDEPENDENT_AMBULATORY_CARE_PROVIDER_SITE_OTHER): Payer: Self-pay | Admitting: Physician Assistant

## 2023-06-06 ENCOUNTER — Encounter (INDEPENDENT_AMBULATORY_CARE_PROVIDER_SITE_OTHER): Payer: Self-pay | Admitting: Physician Assistant

## 2023-06-06 ENCOUNTER — Other Ambulatory Visit: Payer: Self-pay

## 2023-06-06 VITALS — BP 132/60 | HR 84 | Ht 65.0 in | Wt 169.5 lb

## 2023-06-06 DIAGNOSIS — M545 Low back pain, unspecified: Secondary | ICD-10-CM | POA: Insufficient documentation

## 2023-06-06 DIAGNOSIS — F419 Anxiety disorder, unspecified: Secondary | ICD-10-CM | POA: Insufficient documentation

## 2023-06-06 DIAGNOSIS — I10 Essential (primary) hypertension: Secondary | ICD-10-CM | POA: Insufficient documentation

## 2023-06-06 DIAGNOSIS — F32A Depression, unspecified: Secondary | ICD-10-CM | POA: Insufficient documentation

## 2023-06-06 DIAGNOSIS — D72829 Elevated white blood cell count, unspecified: Secondary | ICD-10-CM | POA: Insufficient documentation

## 2023-06-06 DIAGNOSIS — G8929 Other chronic pain: Secondary | ICD-10-CM | POA: Insufficient documentation

## 2023-06-06 LAB — CBC WITH DIFF
BASOPHIL #: 0.1 10*3/uL (ref 0.00–0.10)
BASOPHIL %: 1 % (ref 0–1)
EOSINOPHIL #: 0.1 10*3/uL (ref 0.00–0.50)
EOSINOPHIL %: 1 % (ref 1–8)
HCT: 45.1 % (ref 36.7–47.1)
HGB: 15 g/dL (ref 12.5–16.3)
LYMPHOCYTE #: 3.2 10*3/uL (ref 1.00–3.20)
LYMPHOCYTE %: 32 % (ref 15–43)
MCH: 28.7 pg (ref 23.8–33.4)
MCHC: 33.3 g/dL (ref 32.5–36.3)
MCV: 86.1 fL (ref 73.0–96.2)
MONOCYTE #: 1 10*3/uL (ref 0.30–1.10)
MONOCYTE %: 10 % (ref 6–14)
MPV: 9.2 fL (ref 7.4–11.4)
NEUTROPHIL #: 5.8 10*3/uL (ref 1.70–7.60)
NEUTROPHIL %: 57 % (ref 44–74)
PLATELETS: 278 10*3/uL (ref 140–440)
RBC: 5.24 10*6/uL (ref 4.06–5.63)
RDW: 13 % (ref 12.1–16.2)
WBC: 10.2 10*3/uL (ref 3.6–10.2)

## 2023-06-06 MED ORDER — LISINOPRIL 10 MG TABLET
10.0000 mg | ORAL_TABLET | Freq: Every day | ORAL | 1 refills | Status: DC
Start: 2023-06-06 — End: 2023-12-05

## 2023-06-06 NOTE — Progress Notes (Signed)
 INTERNAL MEDICINE, BUILDING A  [address]  [place] [address]          Follow Up        Reason for Visit: Follow Up (1 month follow up for Essential HTN.) labs    History of Present Illness  Gregory Case is a 68 y.o. male who is being seen today in office for blood pressure/hypertension with reading today 132/60.  Pt states he feels well and denies any headaches dizziness chest pain. Continues to take prinivil  10 mg po daily.    F/U chronic anxiety/depresion w pt on prozac  10 mg daily as well as clonazepam  1mg  prn. Pt states he is doing well at this time and denies any panic attacks or increased anxiety/depression sx.     Follow-up chronic back pain/DJD/OA for which patient has been on long-term narcotic use for multiple years tolerated ;hx of TKR noted in past and most recently started having increased left lower back pain w radiculopathy of left leg. [facility] with pain much better since. Ortho eval noted:    Encounter Date: 02/05/2023  Note Received: 02/06/23 1043  Orders   Spine Injection: L5-S1     Post-Procedure Diagnoses   Lumbar disc herniation with radiculopathy             Procedures:    ILESI L5-S1      Performed by: Haydee Lipa, MD  Authorized by: Haydee Lipa, MD          Consent Given by:  Patient  Site marked: the procedure site was marked    Timeout: prior to procedure the correct patient, procedure, and site was verified    Preparation:  Skin prepped with Hibiclens  Procedure:  ILESI  Indications:  Therapeutic benefit  Guidance:  Fluoroscopy  Levels:  L5-S1  Site(s):     Pt once again w arthritic pains in the hands knees and sometimes between dosing of Norco he uses Duexis or motrin. Request medication refills  today and no abuse history noted and patient compliant with urine drug screen.    F/u labs also needed to check WBC after having elevated levels noted. WBC 2 weeks ago down to 10.4 but 4 months ago was 25.7. pt had  received steroids which most likely affected the levels. Pt states he feels good and denies increased fatigue wt changes. He does bruise easy but states has had issues w that for years.    Colonoscopy up-to-date; will be due again in 2026     flu shot up to date    PHQ Questionnaire         Past Medical History:   Diagnosis Date    Abnormal glucose level     Anxiety     Chronic back pain     COVID-19 vaccine series completed     Depression     Mixed hyperlipidemia     Osteoarthritis          Past Surgical History:   Procedure Laterality Date    COLONOSCOPY W/ POLYPECTOMY      HX BACK SURGERY      HX CARPAL TUNNEL RELEASE      HX KNEE REPLACMENT Right     HX TONSILLECTOMY        Family Medical History:       Problem Relation (Age of Onset)    Coronary Artery Disease Mother    Diabetes type II Mother  Hypertension (High Blood Pressure) Other            Social History     Tobacco Use    Smoking status: Never    Smokeless tobacco: Never   Substance Use Topics    Alcohol use: Yes     Comment: few times a week    Drug use: Never     Medication:  clonazePAM  (KLONOPIN ) 1 mg Oral Tablet, Take 1 Tablet (1 mg total) by mouth Every night  colchicine  0.6 mg Oral Tablet, Take 1 Tablet (0.6 mg total) by mouth Daily  FLUoxetine  (PROZAC ) 10 mg Oral Capsule, Take 1 Capsule (10 mg total) by mouth Once a day  HYDROcodone -acetaminophen  (NORCO) 7.5-325 mg Oral Tablet, Take 1 Tablet by mouth Every 8 hours as needed for Pain for up to 29 days  lisinopriL  (PRINIVIL ) 10 mg Oral Tablet, Take 1 Tablet (10 mg total) by mouth Once a day  simvastatin  (ZOCOR ) 40 mg Oral Tablet, Take 1 Tablet (40 mg total) by mouth Every night  tiZANidine  (ZANAFLEX ) 4 mg Oral Tablet, Take 1 Tablet (4 mg total) by mouth Three times a day as needed (Patient not taking: Reported on 05/21/2023)    No facility-administered medications prior to visit.    Allergies:  Allergies   Allergen Reactions    Meloxicam Hives/ Urticaria       Physical Exam:  Vitals:    06/06/23  0845   BP: 132/60   Pulse: 84   SpO2: 98%   Weight: 76.9 kg (169 lb 8 oz)   Height: 1.651 m (5\' 5" )   BMI: 28.21        Vitals reviewed  General appearance: alert, cooperative, in no acute distress  HEENT: Ears: clear, no effusion, no erythema; Throat: non-erythematous; no LAD, neck supple, moist mucus membranes  Lungs: clear to auscultation bilaterally; no wheezes or rhonchi   Heart: regular rate and rhythm; normal S1 & S2; no murmur  Abdomen: soft, non-tender, not distended; bowel sounds present  Extremities: extremities normal ROM, +arthritic changes bilat hands/knees w hx of previous RT TKR - scar noted  +straight leg raise left leg at 85 degrees  Gait stable  no cyanosis or edema , no rash  +purpura bilat upper extrem  Psych: alert and oriented x 3  Neuro: CN 2-12 grossly intact    ICD-10-CM    1. Essential hypertension  I10       2. Anxiety  F41.9       3. Depression, unspecified depression type  F32.A       4. Chronic low back pain, unspecified back pain laterality, unspecified whether sciatica present  M54.50     G89.29       5. Leukocytosis, unspecified type  D72.829 CBC/DIFF         Labs obtained in office  Continue klonopin  1mg  nightly  Continue Norco 7.5mg  q 8 hrs- refills given  Continue diet wt management  F/u pending labs otherwise as noted       Orders Placed This Encounter    CBC/DIFF    CBC WITH DIFF         Post-Discharge Follow Up Appointments       Wednesday Jul 04, 2023    Return Patient Visit with Cloyce Darby, PA-C at  8:00 AM      Wednesday Aug 01, 2023    Return Patient Visit with Cloyce Darby, PA-C at  8:15 AM      Wednesday Aug 29, 2023  Return Patient Visit with Cloyce Darby, PA-C at  8:30 AM      Monday Jun 02, 2024    Return Telephone Visit with Cloyce Darby, PA-C at  2:00 PM      Internal Medicine, Building A  Building Gregory Case  [address]  [place] [address]  [phone]            Seek medical attention for new or worsening symptoms.  Patient has been seen in this clinic  within the last 3 years.     Adryanna Friedt, PA-C          This note was partially created using MModal Fluency Direct system (voice recognition software) and is inherently subject to errors including those of syntax and "sound-alike" substitutions which may escape proofreading.  In such instances, original meaning may be extrapolated by contextual derivation.

## 2023-06-06 NOTE — Nursing Note (Signed)
 Patient is here for 1 month follow up essential HTN. Refill. Lab results.

## 2023-06-06 NOTE — Telephone Encounter (Signed)
Refill for hydrocodone °

## 2023-06-07 ENCOUNTER — Ambulatory Visit (INDEPENDENT_AMBULATORY_CARE_PROVIDER_SITE_OTHER): Payer: Self-pay

## 2023-06-08 LAB — RHEUMATOID FACTOR, SERUM: RHEUMATOID FACTOR: <13 [IU]/mL (ref <=30)

## 2023-06-08 LAB — HEP-2 SUBSTRATE ANTINUCLEAR ANTIBODIES (ANA), SERUM: ANA INTERPRETATION: NEGATIVE

## 2023-06-15 ENCOUNTER — Other Ambulatory Visit (INDEPENDENT_AMBULATORY_CARE_PROVIDER_SITE_OTHER): Payer: Self-pay | Admitting: Physician Assistant

## 2023-06-15 MED ORDER — HYDROCODONE 7.5 MG-ACETAMINOPHEN 325 MG TABLET
1.0000 | ORAL_TABLET | Freq: Three times a day (TID) | ORAL | 0 refills | Status: DC | PRN
Start: 2023-06-15 — End: 2023-07-11

## 2023-06-28 ENCOUNTER — Ambulatory Visit: Payer: BC Managed Care – PPO | Admitting: Physician Assistant

## 2023-07-04 ENCOUNTER — Ambulatory Visit (INDEPENDENT_AMBULATORY_CARE_PROVIDER_SITE_OTHER): Payer: BC Managed Care – PPO | Admitting: Physician Assistant

## 2023-07-11 ENCOUNTER — Encounter (INDEPENDENT_AMBULATORY_CARE_PROVIDER_SITE_OTHER): Payer: Self-pay | Admitting: Physician Assistant

## 2023-07-11 ENCOUNTER — Other Ambulatory Visit: Payer: Self-pay

## 2023-07-11 ENCOUNTER — Other Ambulatory Visit (INDEPENDENT_AMBULATORY_CARE_PROVIDER_SITE_OTHER): Payer: Self-pay | Admitting: Physician Assistant

## 2023-07-11 ENCOUNTER — Ambulatory Visit: Attending: Physician Assistant | Admitting: Physician Assistant

## 2023-07-11 VITALS — BP 112/70 | HR 57 | Ht 65.0 in | Wt 175.4 lb

## 2023-07-11 DIAGNOSIS — M545 Low back pain, unspecified: Secondary | ICD-10-CM | POA: Insufficient documentation

## 2023-07-11 DIAGNOSIS — G8929 Other chronic pain: Secondary | ICD-10-CM | POA: Insufficient documentation

## 2023-07-11 DIAGNOSIS — F419 Anxiety disorder, unspecified: Secondary | ICD-10-CM | POA: Insufficient documentation

## 2023-07-11 DIAGNOSIS — F32A Depression, unspecified: Secondary | ICD-10-CM | POA: Insufficient documentation

## 2023-07-11 DIAGNOSIS — I10 Essential (primary) hypertension: Secondary | ICD-10-CM | POA: Insufficient documentation

## 2023-07-11 NOTE — Progress Notes (Signed)
 [facility]  [address]  [place] [address]          Follow Up        Reason for Visit: Follow Up (1 month follow up for Essential HTN, refills)     History of Present Illness  Gregory Case is a 68 y.o. male who is being seen today in office for blood pressure/hypertension with reading today 112/70.  Pt states he feels well and denies any headaches dizziness chest pain. Continues to take prinivil  10 mg po daily.    F/U chronic anxiety/depresion w pt on prozac  10 mg daily as well as clonazepam  1mg  prn. Pt states he is doing well at this time and denies any panic attacks or increased anxiety/depression sx.     Follow-up chronic back pain/DJD/OA for which patient has been on long-term narcotic use for multiple years tolerated ;hx of TKR noted in past and most recently started having increased left lower back pain w radiculopathy of left leg. [facility] with pain much better since. Ortho eval noted:    Encounter Date: 02/05/2023  Note Received: 02/06/23 1043  Orders   Spine Injection: L5-S1     Post-Procedure Diagnoses   Lumbar disc herniation with radiculopathy             Procedures:    ILESI L5-S1      Performed by: Haydee Lipa, MD  Authorized by: Haydee Lipa, MD          Consent Given by:  Patient  Site marked: the procedure site was marked    Timeout: prior to procedure the correct patient, procedure, and site was verified    Preparation:  Skin prepped with Hibiclens  Procedure:  ILESI  Indications:  Therapeutic benefit  Guidance:  Fluoroscopy  Levels:  L5-S1  Site(s):     Pt once again w arthritic pains in the hands knees and sometimes between dosing of Norco he uses motrin. Request medication refills  today and no abuse history noted and patient compliant with urine drug screen.    Colonoscopy up-to-date; will be due again in 2026.     flu shot up to date    PHQ Questionnaire  Little interest or pleasure in doing things.:  Not at all  Feeling down, depressed, or hopeless: Not at all  PHQ 2 Total: 0  Trouble falling or staying asleep, or sleeping too much.: Not at all  Feeling tired or having little energy: Not at all  Poor appetite or overeating: Not at all  Feeling bad about yourself/ that you are a failure in the past 2 weeks?: Not at all  Trouble concentrating on things in the past 2 weeks?: Not at all  Moving/Speaking slowly or being fidgety or restless  in the past 2 weeks?: Not at all  Thoughts that you would be better off DEAD, or of hurting yourself in some way.: Not at all  If you checked off any problems, how difficult have these problems made it for you to do your work, take care of things at home, or get along with other people?: Not difficult at all  PHQ 9 Total: 0      Past Medical History:   Diagnosis Date    Abnormal glucose level     Anxiety     Chronic back pain     COVID-19 vaccine series completed     Depression     Mixed  hyperlipidemia     Osteoarthritis          Past Surgical History:   Procedure Laterality Date    COLONOSCOPY W/ POLYPECTOMY      HX BACK SURGERY      HX CARPAL TUNNEL RELEASE      HX KNEE REPLACMENT Right     HX TONSILLECTOMY        Family Medical History:       Problem Relation (Age of Onset)    Coronary Artery Disease Mother    Diabetes type II Mother    Hypertension (High Blood Pressure) Other            Social History     Tobacco Use    Smoking status: Never    Smokeless tobacco: Never   Substance Use Topics    Alcohol use: Yes     Comment: few times a week    Drug use: Never     Medication:  clonazePAM  (KLONOPIN ) 1 mg Oral Tablet, Take 1 Tablet (1 mg total) by mouth Every night  colchicine  0.6 mg Oral Tablet, Take 1 Tablet (0.6 mg total) by mouth Daily (Patient not taking: Reported on 07/11/2023)  FLUoxetine  (PROZAC ) 10 mg Oral Capsule, Take 1 Capsule (10 mg total) by mouth Once a day  HYDROcodone -acetaminophen  (NORCO) 7.5-325 mg Oral Tablet, Take 1 Tablet by mouth Every 8 hours as needed for  Pain for up to 29 days  lisinopriL  (PRINIVIL ) 10 mg Oral Tablet, Take 1 Tablet (10 mg total) by mouth Daily  simvastatin  (ZOCOR ) 40 mg Oral Tablet, Take 1 Tablet (40 mg total) by mouth Every night  tiZANidine  (ZANAFLEX ) 4 mg Oral Tablet, Take 1 Tablet (4 mg total) by mouth Three times a day as needed (Patient not taking: Reported on 05/21/2023)    No facility-administered medications prior to visit.    Allergies:  Allergies   Allergen Reactions    Meloxicam Hives/ Urticaria       Physical Exam:  Vitals:    07/11/23 0849   BP: 112/70   Pulse: 57   SpO2: 98%   Weight: 79.6 kg (175 lb 6.4 oz)   Height: 1.651 m (5\' 5" )   BMI: 29.19          Vitals reviewed  General appearance: alert, cooperative, in no acute distress  HEENT: Ears: clear, no effusion, no erythema; Throat: non-erythematous; no LAD, neck supple, moist mucus membranes  Lungs: clear to auscultation bilaterally; no wheezes or rhonchi   Heart: regular rate and rhythm; normal S1 & S2; no murmur  Abdomen: soft, non-tender, not distended; bowel sounds present  Extremities: extremities normal ROM, +arthritic changes bilat hands/knees w hx of previous RT TKR - scar noted  +straight leg raise left leg at 85 degrees  Gait stable  no cyanosis or edema , no rash  +purpura bilat upper extrem  Psych: alert and oriented x 3  Neuro: CN 2-12 grossly intact    ICD-10-CM    1. Essential hypertension  I10       2. Anxiety  F41.9       3. Depression, unspecified depression type  F32.A       4. Chronic low back pain, unspecified back pain laterality, unspecified whether sciatica present  M54.50     G89.29         Labs  up to date  Norco 7.5mg  q 8 hrs- refills given  Continue klonopin  1mg  nightly  Continue diet wt management    No  orders of the defined types were placed in this encounter.        Post-Discharge Follow Up Appointments       Wednesday Aug 08, 2023    Return Patient Visit with Genita Nilsson, PA-C at  8:15 AM      Monday Sep 10, 2023    Return Patient Visit with Cloyce Darby, PA-C at  8:15 AM      Monday Jun 02, 2024    Return Telephone Visit with Cloyce Darby, PA-C at  2:00 PM      [facility]  [facility]  [address]  [place] [address]  [phone]            Seek medical attention for new or worsening symptoms.  Patient has been seen in this clinic within the last 3 years.     Dacari Beckstrand, PA-C          This note was partially created using MModal Fluency Direct system (voice recognition software) and is inherently subject to errors including those of syntax and "sound-alike" substitutions which may escape proofreading.  In such instances, original meaning may be extrapolated by contextual derivation.

## 2023-07-11 NOTE — Nursing Note (Signed)
 Patient is here for 1 month follow up for Essential HTN, refills.

## 2023-07-11 NOTE — Telephone Encounter (Signed)
 Hydrocodone

## 2023-07-12 MED ORDER — HYDROCODONE 7.5 MG-ACETAMINOPHEN 325 MG TABLET
1.0000 | ORAL_TABLET | Freq: Three times a day (TID) | ORAL | 0 refills | Status: DC | PRN
Start: 2023-07-12 — End: 2023-08-08

## 2023-08-01 ENCOUNTER — Ambulatory Visit (INDEPENDENT_AMBULATORY_CARE_PROVIDER_SITE_OTHER): Payer: BC Managed Care – PPO | Admitting: Physician Assistant

## 2023-08-08 ENCOUNTER — Other Ambulatory Visit: Payer: Self-pay

## 2023-08-08 ENCOUNTER — Encounter (INDEPENDENT_AMBULATORY_CARE_PROVIDER_SITE_OTHER): Payer: Self-pay | Admitting: Physician Assistant

## 2023-08-08 ENCOUNTER — Ambulatory Visit: Attending: Physician Assistant | Admitting: Physician Assistant

## 2023-08-08 ENCOUNTER — Other Ambulatory Visit (INDEPENDENT_AMBULATORY_CARE_PROVIDER_SITE_OTHER): Payer: Self-pay | Admitting: Physician Assistant

## 2023-08-08 ENCOUNTER — Other Ambulatory Visit (INDEPENDENT_AMBULATORY_CARE_PROVIDER_SITE_OTHER): Payer: Self-pay

## 2023-08-08 ENCOUNTER — Other Ambulatory Visit (HOSPITAL_COMMUNITY): Admitting: Physician Assistant

## 2023-08-08 VITALS — BP 132/78 | HR 70 | Ht 65.0 in | Wt 173.0 lb

## 2023-08-08 DIAGNOSIS — M545 Low back pain, unspecified: Secondary | ICD-10-CM | POA: Insufficient documentation

## 2023-08-08 DIAGNOSIS — Z79899 Other long term (current) drug therapy: Secondary | ICD-10-CM | POA: Insufficient documentation

## 2023-08-08 DIAGNOSIS — Z23 Encounter for immunization: Secondary | ICD-10-CM | POA: Insufficient documentation

## 2023-08-08 DIAGNOSIS — I10 Essential (primary) hypertension: Secondary | ICD-10-CM | POA: Insufficient documentation

## 2023-08-08 DIAGNOSIS — F32A Depression, unspecified: Secondary | ICD-10-CM | POA: Insufficient documentation

## 2023-08-08 DIAGNOSIS — G8929 Other chronic pain: Secondary | ICD-10-CM | POA: Insufficient documentation

## 2023-08-08 DIAGNOSIS — F419 Anxiety disorder, unspecified: Secondary | ICD-10-CM | POA: Insufficient documentation

## 2023-08-08 LAB — DRUG SCREEN, NO CONFIRMATION, URINE
AMPHETAMINES URINE: NEGATIVE
BARBITURATES URINE: NEGATIVE
BENZODIAZEPINES URINE: NEGATIVE
BUPRENORPHINE URINE: NEGATIVE
CANNABINOIDS URINE: POSITIVE — AB
COCAINE METABOLITES URINE: NEGATIVE
FENTANYL, URINE: NEGATIVE
METHADONE URINE: NEGATIVE
OPIATES URINE: POSITIVE — AB
OXYCODONE URINE: NEGATIVE
PCP URINE: NEGATIVE

## 2023-08-08 MED ORDER — CLONAZEPAM 1 MG TABLET
1.0000 mg | ORAL_TABLET | Freq: Every evening | ORAL | 2 refills | Status: DC
Start: 2023-08-08 — End: 2023-09-06

## 2023-08-08 MED ORDER — HYDROCODONE 7.5 MG-ACETAMINOPHEN 325 MG TABLET
1.0000 | ORAL_TABLET | Freq: Three times a day (TID) | ORAL | 0 refills | Status: DC | PRN
Start: 2023-08-08 — End: 2023-09-06

## 2023-08-08 NOTE — Nursing Note (Signed)
Patient is in the office today for routine 1 month follow up for medication refills.

## 2023-08-08 NOTE — Progress Notes (Signed)
 [facility]  [address]  [place] [address]          Follow Up        Reason for Visit: Follow Up (Routine 1 month )     History of Present Illness  Gregory Case is a 68 y.o. male who is being seen today in office for blood pressure/hypertension with reading today 132/78.  Pt states he feels well and denies any headaches dizziness chest pain. Continues to take prinivil  10 mg po daily.    F/U chronic anxiety/depresion w pt on prozac  10 mg daily as well as clonazepam  1mg  prn. Pt states he is doing well at this time and denies any panic attacks or increased anxiety/depression sx.     Follow-up chronic back pain/DJD/OA for which patient has been on long-term narcotic use for multiple years tolerated ;hx of TKR noted in past and most recently started having increased left lower back pain w radiculopathy of left leg. [facility] with pain much better since. Ortho eval noted:    Encounter Date: 02/05/2023  Note Received: 02/06/23 1043  Orders   Spine Injection: L5-S1     Post-Procedure Diagnoses   Lumbar disc herniation with radiculopathy             Procedures:    ILESI L5-S1      Performed by: Haydee Lipa, MD  Authorized by: Haydee Lipa, MD          Consent Given by:  Patient  Site marked: the procedure site was marked    Timeout: prior to procedure the correct patient, procedure, and site was verified    Preparation:  Skin prepped with Hibiclens  Procedure:  ILESI  Indications:  Therapeutic benefit  Guidance:  Fluoroscopy  Levels:  L5-S1  Site(s):     Pt once again w arthritic pains in the hands knees and sometimes between dosing of Norco he uses motrin. Request medication refills  today and no abuse history noted and patient  obtained his urine drug screen today.    Colonoscopy up-to-date; will be due again in 2026.     flu shot up to date; needs a tdap    PHQ Questionnaire         Past Medical History:   Diagnosis Date    Abnormal  glucose level     Anxiety     Chronic back pain     COVID-19 vaccine series completed     Depression     Mixed hyperlipidemia     Osteoarthritis          Past Surgical History:   Procedure Laterality Date    COLONOSCOPY W/ POLYPECTOMY      HX BACK SURGERY      HX CARPAL TUNNEL RELEASE      HX KNEE REPLACMENT Right     HX TONSILLECTOMY        Family Medical History:       Problem Relation (Age of Onset)    Coronary Artery Disease Mother    Diabetes type II Mother    Hypertension (High Blood Pressure) Other            Social History     Tobacco Use    Smoking status: Never    Smokeless tobacco: Never   Substance Use Topics    Alcohol use: Yes     Comment: few times a week    Drug use:  Never     Medication:  colchicine  0.6 mg Oral Tablet, Take 1 Tablet (0.6 mg total) by mouth Daily  FLUoxetine  (PROZAC ) 10 mg Oral Capsule, Take 1 Capsule (10 mg total) by mouth Once a day  HYDROcodone -acetaminophen  (NORCO) 7.5-325 mg Oral Tablet, Take 1 Tablet by mouth Every 8 hours as needed for Pain for up to 29 days  lisinopriL  (PRINIVIL ) 10 mg Oral Tablet, Take 1 Tablet (10 mg total) by mouth Daily  simvastatin  (ZOCOR ) 40 mg Oral Tablet, Take 1 Tablet (40 mg total) by mouth Every night  tiZANidine  (ZANAFLEX ) 4 mg Oral Tablet, Take 1 Tablet (4 mg total) by mouth Three times a day as needed  clonazePAM  (KLONOPIN ) 1 mg Oral Tablet, Take 1 Tablet (1 mg total) by mouth Every night    No facility-administered medications prior to visit.    Allergies:  Allergies   Allergen Reactions    Meloxicam Hives/ Urticaria       Physical Exam:  Vitals:    08/08/23 0835   BP: 132/78   Pulse: 70   SpO2: 98%   Weight: 78.5 kg (173 lb)   Height: 1.651 m (5\' 5" )   BMI: 28.79          Vitals reviewed  General appearance: alert, cooperative, in no acute distress  HEENT: Ears: clear, no effusion, no erythema; Throat: non-erythematous; no LAD, neck supple, moist mucus membranes  Lungs: clear to auscultation bilaterally; no wheezes or rhonchi   Heart: regular  rate and rhythm; normal S1 & S2; no murmur  Abdomen: soft, non-tender, not distended; bowel sounds present  Extremities: extremities normal ROM, +arthritic changes bilat hands/knees w hx of previous RT TKR - scar noted  +straight leg raise left leg at 85 degrees  Gait stable  no cyanosis or edema , no rash  +purpura bilat upper extrem  Psych: alert and oriented x 3  Neuro: CN 2-12 grossly intact    Assessment:    ICD-10-CM    1. Essential hypertension  I10       2. Anxiety  F41.9       3. Depression, unspecified depression type  F32.A       4. Chronic low back pain, unspecified back pain laterality, unspecified whether sciatica present  M54.50     G89.29       5. Long-term use of high-risk medication  Z79.899 DRUG SCREEN, NO CONFIRMATION, URINE        Urine drug screen obtained  TDAP given  Norco 7.5mg  q 8 hrs- refills given  RX klonopin  1mg  nightly given  Continue diet wt management    Orders Placed This Encounter    TETANUS,DIPTHERIA,ACEL PERT, > OR EQUAL TO 69YRS OLD(ADMIN)    DRUG SCREEN, NO CONFIRMATION, URINE    clonazePAM  (KLONOPIN ) 1 mg Oral Tablet         Post-Discharge Follow Up Appointments       Thursday Sep 06, 2023    Return Patient Visit with Cloyce Darby, PA-C at  8:00 AM      Monday Jun 02, 2024    Return Telephone Visit with Cloyce Darby, PA-C at  2:00 PM      [facility]  [facility]  [address]  [place] [address]  [phone]                  Seek medical attention for new or worsening symptoms.  Patient has been seen in this clinic within the last  3 years.     Astryd Pearcy, PA-C          This note was partially created using MModal Fluency Direct system (voice recognition software) and is inherently subject to errors including those of syntax and "sound-alike" substitutions which may escape proofreading.  In such instances, original meaning may be extrapolated by contextual derivation.

## 2023-08-08 NOTE — Telephone Encounter (Signed)
 Routine 1 month follow up for medication refill.

## 2023-08-29 ENCOUNTER — Ambulatory Visit (INDEPENDENT_AMBULATORY_CARE_PROVIDER_SITE_OTHER): Payer: BC Managed Care – PPO | Admitting: Physician Assistant

## 2023-09-06 ENCOUNTER — Encounter (INDEPENDENT_AMBULATORY_CARE_PROVIDER_SITE_OTHER): Payer: Self-pay | Admitting: Physician Assistant

## 2023-09-06 ENCOUNTER — Ambulatory Visit: Attending: Physician Assistant | Admitting: Physician Assistant

## 2023-09-06 ENCOUNTER — Other Ambulatory Visit: Payer: Self-pay

## 2023-09-06 ENCOUNTER — Other Ambulatory Visit (INDEPENDENT_AMBULATORY_CARE_PROVIDER_SITE_OTHER): Payer: Self-pay | Admitting: Physician Assistant

## 2023-09-06 VITALS — BP 128/80 | HR 76 | Temp 97.6°F | Ht 65.0 in | Wt 173.0 lb

## 2023-09-06 DIAGNOSIS — F32A Depression, unspecified: Secondary | ICD-10-CM | POA: Insufficient documentation

## 2023-09-06 DIAGNOSIS — F419 Anxiety disorder, unspecified: Secondary | ICD-10-CM | POA: Insufficient documentation

## 2023-09-06 DIAGNOSIS — M1990 Unspecified osteoarthritis, unspecified site: Secondary | ICD-10-CM

## 2023-09-06 DIAGNOSIS — I10 Essential (primary) hypertension: Secondary | ICD-10-CM | POA: Insufficient documentation

## 2023-09-06 DIAGNOSIS — G8929 Other chronic pain: Secondary | ICD-10-CM | POA: Insufficient documentation

## 2023-09-06 DIAGNOSIS — M545 Low back pain, unspecified: Secondary | ICD-10-CM | POA: Insufficient documentation

## 2023-09-06 MED ORDER — CLONAZEPAM 1 MG TABLET
1.0000 mg | ORAL_TABLET | Freq: Every evening | ORAL | 2 refills | Status: DC
Start: 2023-09-06 — End: 2023-11-09

## 2023-09-06 NOTE — Progress Notes (Signed)
 [facility]  [address]  [place] [address]          Follow Up        Reason for Visit: Follow Up (1 month follow up no problems)     History of Present Illness  Gregory Case is a 68 y.o. male who is being seen today in office for blood pressure/hypertension with reading today 128/80.  Pt states he feels well and denies any headaches dizziness chest pain. Continues to take prinivil  10 mg po daily.    F/U chronic anxiety/depresion w pt on prozac  10 mg daily as well as clonazepam  1mg  prn. Pt states he is doing well at this time and denies any panic attacks or increased anxiety/depression sx.     Follow-up chronic back pain/DJD/OA for which patient has been on long-term narcotic use for multiple years tolerated ;hx of TKR noted in past and most recently started having increased left lower back pain w radiculopathy of left leg. [facility] with pain much better since. Ortho eval noted:    Encounter Date: 02/05/2023  Note Received: 02/06/23 1043  Orders   Spine Injection: L5-S1     Post-Procedure Diagnoses   Lumbar disc herniation with radiculopathy             Procedures:    ILESI L5-S1      Performed by: Gregory Coy, MD  Authorized by: Gregory Coy, MD          Consent Given by:  Patient  Site marked: the procedure site was marked    Timeout: prior to procedure the correct patient, procedure, and site was verified    Preparation:  Skin prepped with Hibiclens  Procedure:  ILESI  Indications:  Therapeutic benefit  Guidance:  Fluoroscopy  Levels:  L5-S1  Site(s):     Pt once again w arthritic pains in the hands knees and sometimes between dosing of Norco he uses motrin. Request medication refills  today and no abuse history noted and patient  obtained his urine drug screen today.    Colonoscopy up-to-date; will be due again in 2026.      PHQ Questionnaire         Past Medical History:   Diagnosis Date    Abnormal glucose level      Anxiety     Chronic back pain     COVID-19 vaccine series completed     Depression     Mixed hyperlipidemia     Osteoarthritis          Past Surgical History:   Procedure Laterality Date    COLONOSCOPY W/ POLYPECTOMY      HX BACK SURGERY      HX CARPAL TUNNEL RELEASE      HX KNEE REPLACMENT Right     HX TONSILLECTOMY        Family Medical History:       Problem Relation (Age of Onset)    Coronary Artery Disease Mother    Diabetes type II Mother    Hypertension (High Blood Pressure) Other            Social History     Tobacco Use    Smoking status: Never    Smokeless tobacco: Never   Substance Use Topics    Alcohol use: Yes     Comment: few times a week    Drug use: Never     Medication:  colchicine   0.6 mg Oral Tablet, Take 1 Tablet (0.6 mg total) by mouth Daily  FLUoxetine  (PROZAC ) 10 mg Oral Capsule, Take 1 Capsule (10 mg total) by mouth Once a day  HYDROcodone -acetaminophen  (NORCO) 7.5-325 mg Oral Tablet, Take 1 Tablet by mouth Every 8 hours as needed for Pain for up to 29 days  lisinopriL  (PRINIVIL ) 10 mg Oral Tablet, Take 1 Tablet (10 mg total) by mouth Daily  simvastatin  (ZOCOR ) 40 mg Oral Tablet, Take 1 Tablet (40 mg total) by mouth Every night  tiZANidine  (ZANAFLEX ) 4 mg Oral Tablet, Take 1 Tablet (4 mg total) by mouth Three times a day as needed  clonazePAM  (KLONOPIN ) 1 mg Oral Tablet, Take 1 Tablet (1 mg total) by mouth Every night    No facility-administered medications prior to visit.    Allergies:  Allergies   Allergen Reactions    Meloxicam Hives/ Urticaria       Physical Exam:  Vitals:    09/06/23 0757   BP: 128/80   Pulse: 76   Temp: 36.4 C (97.6 F)   SpO2: 96%   Weight: 78.5 kg (173 lb)   Height: 1.651 m (5' 5)   BMI: 28.79       Vitals reviewed  General appearance: alert, cooperative, in no acute distress  HEENT: Ears: clear, no effusion, no erythema; Throat: non-erythematous; no LAD, neck supple, moist mucus membranes  Lungs: clear to auscultation bilaterally; no wheezes or rhonchi   Heart:  regular rate and rhythm; normal S1 & S2; no murmur  Abdomen: soft, non-tender, not distended; bowel sounds present  Extremities: extremities normal ROM, +arthritic changes bilat hands/knees w hx of previous RT TKR - scar noted  +straight leg raise left leg at 85 degrees  Gait stable  no cyanosis or edema , no rash  +purpura bilat upper extrem  Psych: alert and oriented x 3  Neuro: CN 2-12 grossly intact  Assessment & Plan  Essential hypertension  Controlled on prinivil  10 mg qd  Continue low na diet/wt management  Anxiety  Stable on klonopin  1mg - refills given  Depression, unspecified depression type  Continue Prozac  10 mg qd  Chronic low back pain, unspecified back pain laterality, unspecified whether sciatica present  On chronic pain management  Continue Norco 7.5mg  q 8 hrs- refills given    Orders Placed This Encounter    clonazePAM  (KLONOPIN ) 1 mg Oral Tablet       Post-Discharge Follow Up Appointments       Monday Oct 08, 2023    Return Patient Visit with Scharlene No, PA-C at  7:30 AM      Wednesday Nov 07, 2023    Return Patient Visit with Scharlene No, PA-C at  8:15 AM      Monday Jun 02, 2024    Return Telephone Visit with Scharlene No, PA-C at  2:00 PM      [facility]  [facility]  [address]  [place] [address]  [phone]               Seek medical attention for new or worsening symptoms.  Patient has been seen in this clinic within the last 3 years.     Winnifred Dufford, PA-C          This note was partially created using MModal Fluency Direct system (voice recognition software) and is inherently subject to errors including those of syntax and sound-alike substitutions which may escape proofreading.  In such instances, original meaning may  be extrapolated by contextual derivation.

## 2023-09-06 NOTE — Assessment & Plan Note (Signed)
 On chronic pain management  Continue Norco 7.5mg  q 8 hrs-refills given

## 2023-09-06 NOTE — Assessment & Plan Note (Signed)
 Controlled on prinivil  10 mg qd  Continue low na diet/wt management

## 2023-09-06 NOTE — Nursing Note (Signed)
 1 month follow up no problems

## 2023-09-06 NOTE — Assessment & Plan Note (Signed)
Continue Prozac 10 mg qd

## 2023-09-06 NOTE — Assessment & Plan Note (Signed)
 Stable on klonopin 1mg - refills given

## 2023-09-07 MED ORDER — HYDROCODONE 7.5 MG-ACETAMINOPHEN 325 MG TABLET
1.0000 | ORAL_TABLET | Freq: Three times a day (TID) | ORAL | 0 refills | Status: DC | PRN
Start: 2023-09-07 — End: 2023-10-11

## 2023-09-10 ENCOUNTER — Ambulatory Visit (INDEPENDENT_AMBULATORY_CARE_PROVIDER_SITE_OTHER): Admitting: Physician Assistant

## 2023-10-08 ENCOUNTER — Ambulatory Visit (INDEPENDENT_AMBULATORY_CARE_PROVIDER_SITE_OTHER): Payer: Self-pay | Admitting: Physician Assistant

## 2023-10-11 ENCOUNTER — Encounter (INDEPENDENT_AMBULATORY_CARE_PROVIDER_SITE_OTHER): Payer: Self-pay | Admitting: Physician Assistant

## 2023-10-11 ENCOUNTER — Other Ambulatory Visit (INDEPENDENT_AMBULATORY_CARE_PROVIDER_SITE_OTHER): Payer: Self-pay | Admitting: Physician Assistant

## 2023-10-11 ENCOUNTER — Ambulatory Visit: Payer: Self-pay | Attending: Physician Assistant | Admitting: Physician Assistant

## 2023-10-11 ENCOUNTER — Other Ambulatory Visit: Payer: Self-pay

## 2023-10-11 VITALS — BP 136/78 | HR 77 | Ht 65.0 in | Wt 176.4 lb

## 2023-10-11 DIAGNOSIS — F32A Depression, unspecified: Secondary | ICD-10-CM | POA: Insufficient documentation

## 2023-10-11 DIAGNOSIS — M545 Low back pain, unspecified: Secondary | ICD-10-CM | POA: Insufficient documentation

## 2023-10-11 DIAGNOSIS — G8929 Other chronic pain: Secondary | ICD-10-CM | POA: Insufficient documentation

## 2023-10-11 DIAGNOSIS — F419 Anxiety disorder, unspecified: Secondary | ICD-10-CM | POA: Insufficient documentation

## 2023-10-11 DIAGNOSIS — I10 Essential (primary) hypertension: Secondary | ICD-10-CM | POA: Insufficient documentation

## 2023-10-11 MED ORDER — SIMVASTATIN 40 MG TABLET
40.0000 mg | ORAL_TABLET | Freq: Every evening | ORAL | 1 refills | Status: DC
Start: 2023-10-11 — End: 2023-12-05

## 2023-10-11 MED ORDER — DULOXETINE 30 MG CAPSULE,DELAYED RELEASE
30.0000 mg | DELAYED_RELEASE_CAPSULE | Freq: Every day | ORAL | 3 refills | Status: DC
Start: 2023-10-11 — End: 2023-12-05

## 2023-10-11 MED ORDER — HYDROCODONE 7.5 MG-ACETAMINOPHEN 325 MG TABLET
1.0000 | ORAL_TABLET | Freq: Three times a day (TID) | ORAL | 0 refills | Status: DC | PRN
Start: 2023-10-11 — End: 2023-11-09

## 2023-10-11 NOTE — Assessment & Plan Note (Signed)
 Controlled on prinivil  10 mg qd  Continue low na diet/wt management

## 2023-10-11 NOTE — Assessment & Plan Note (Signed)
 Stable on klonopin 1mg - refills given

## 2023-10-11 NOTE — Progress Notes (Signed)
 [facility]  [address]  [place] [address]          Follow Up        Reason for Visit: Follow Up     History of Present Illness  Gregory Case is a 68 y.o. male who is being seen today in office for blood pressure/hypertension with reading today 136/78.  Pt states he feels well and denies any headaches dizziness chest pain. Continues to take prinivil  10 mg po daily.    F/U chronic anxiety/depresion w pt on prozac  10 mg daily as well as clonazepam  1mg  prn. Pt states anxiety is overall stable and he denies any panic attacks or increased anxiety sx.  Does state that his wife thinks the Prozac  needs to be changed because he gets hateful /moody a lot. She wanted him to discuss changing med to Cymbalta  if poss. Pt agreeable to trying something new.    Follow-up chronic back pain/DJD/OA for which patient has been on long-term narcotic use for multiple years tolerated ;hx of TKR noted in past and most recently started having increased left lower back pain w radiculopathy of left leg. [facility] with pain much better since. Ortho eval noted:    Encounter Date: 02/05/2023  Note Received: 02/06/23 1043  Orders   Spine Injection: L5-S1     Post-Procedure Diagnoses   Lumbar disc herniation with radiculopathy             Procedures:    ILESI L5-S1      Performed by: Darlis Coy, MD  Authorized by: Darlis Coy, MD          Consent Given by:  Patient  Site marked: the procedure site was marked    Timeout: prior to procedure the correct patient, procedure, and site was verified    Preparation:  Skin prepped with Hibiclens  Procedure:  ILESI  Indications:  Therapeutic benefit  Guidance:  Fluoroscopy  Levels:  L5-S1  Site(s):     Pt once again w arthritic pains in the hands knees and sometimes between dosing of Norco he uses motrin. Request medication refills  today and no abuse history noted and patient  obtained his urine drug screen  today.    Has an appt w his ortho concerning his RT knee on Monday. Hx of TKR noted w recent fall and pain.    Colonoscopy up-to-date; will be due again in 2026.      PHQ Questionnaire  Little interest or pleasure in doing things.: Not at all  Feeling down, depressed, or hopeless: Not at all  PHQ 2 Total: 0  Trouble falling or staying asleep, or sleeping too much.: Not at all  Feeling tired or having little energy: Not at all  Poor appetite or overeating: Not at all  Feeling bad about yourself/ that you are a failure in the past 2 weeks?: Not at all  Trouble concentrating on things in the past 2 weeks?: Not at all  Moving/Speaking slowly or being fidgety or restless  in the past 2 weeks?: Not at all  Thoughts that you would be better off DEAD, or of hurting yourself in some way.: Not at all  If you checked off any problems, how difficult have these problems made it for you to do your work, take care of things at home, or get along with other people?: Not difficult at all  PHQ 9 Total: 0  Interpretation of  Total Score: 0-4 No depression      Past Medical History:   Diagnosis Date    Abnormal glucose level     Anxiety     Chronic back pain     COVID-19 vaccine series completed     Depression     Mixed hyperlipidemia     Osteoarthritis          Past Surgical History:   Procedure Laterality Date    COLONOSCOPY W/ POLYPECTOMY      HX BACK SURGERY      HX CARPAL TUNNEL RELEASE      HX KNEE REPLACMENT Right     HX TONSILLECTOMY        Family Medical History:       Problem Relation (Age of Onset)    Coronary Artery Disease Mother    Diabetes type II Mother    Hypertension (High Blood Pressure) Other            Social History     Tobacco Use    Smoking status: Never    Smokeless tobacco: Never   Substance Use Topics    Alcohol use: Yes     Comment: few times a week    Drug use: Never     Medication:  clonazePAM  (KLONOPIN ) 1 mg Oral Tablet, Take 1 Tablet (1 mg total) by mouth Every night  colchicine  0.6 mg Oral Tablet, Take 1  Tablet (0.6 mg total) by mouth Daily  lisinopriL  (PRINIVIL ) 10 mg Oral Tablet, Take 1 Tablet (10 mg total) by mouth Daily  simvastatin  (ZOCOR ) 40 mg Oral Tablet, Take 1 Tablet (40 mg total) by mouth Every night  tiZANidine  (ZANAFLEX ) 4 mg Oral Tablet, Take 1 Tablet (4 mg total) by mouth Three times a day as needed  FLUoxetine  (PROZAC ) 10 mg Oral Capsule, Take 1 Capsule (10 mg total) by mouth Once a day    No facility-administered medications prior to visit.    Allergies:  Allergies   Allergen Reactions    Meloxicam Hives/ Urticaria       Physical Exam:  Vitals:    10/11/23 0818   BP: 136/78   Pulse: 77   SpO2: 99%   Weight: 80 kg (176 lb 6.4 oz)   Height: 1.651 m (5' 5)   BMI: 29.35       Vitals reviewed  General appearance: alert, cooperative, in no acute distress  HEENT: Ears: clear, no effusion, no erythema; Throat: non-erythematous; no LAD, neck supple, moist mucus membranes  Lungs: clear to auscultation bilaterally; no wheezes or rhonchi   Heart: regular rate and rhythm; normal S1 & S2; no murmur  Abdomen: soft, non-tender, not distended; bowel sounds present  Extremities: extremities normal ROM, +arthritic changes bilat hands/knees w hx of previous RT TKR - scar noted  +straight leg raise left leg at 85 degrees  Gait stable  no cyanosis   +purpura bilat upper extrem  Psych: alert and oriented x 3  Neuro: CN 2-12 grossly intact  Assessment & Plan  Essential hypertension  Controlled on prinivil  10 mg qd  Continue low na diet/wt management  Anxiety  Stable on klonopin  1mg - refills given  Depression, unspecified depression type  D/c Prozac    Start Cymbalta  30 mg qd  Chronic low back pain, unspecified back pain laterality, unspecified whether sciatica present  On chronic pain management  Continue Norco 7.5mg  q 8 hrs- refills given    Orders Placed This Encounter    DULoxetine  (CYMBALTA  DR) 30 mg  Oral Capsule, Delayed Release(E.C.)       Post-Discharge Follow Up Appointments       Monday Nov 12, 2023    Return  Patient Visit with Scharlene No, PA-C at  8:00 AM      Monday Dec 10, 2023    Return Patient Visit with Scharlene No, PA-C at  8:30 AM      Tuesday Jan 08, 2024    Return Patient Visit with Scharlene No, PA-C at  8:00 AM      Tuesday Feb 05, 2024    Return Patient Visit with Scharlene No, PA-C at  8:00 AM      Monday Jun 02, 2024    Return Telephone Visit with Scharlene No, PA-C at  2:00 PM      [facility]  [facility]  [address]  [place] [address]  [phone]               Seek medical attention for new or worsening symptoms.  Patient has been seen in this clinic within the last 3 years.     Amy Scharlene, PA-C      I personally reviewed the documentation of care provided by the certified physician assistant and I agree with her medical decision making, Jhonny Romano, D.O.       This note was partially created using MModal Fluency Direct system (voice recognition software) and is inherently subject to errors including those of syntax and sound-alike substitutions which may escape proofreading.  In such instances, original meaning may be extrapolated by contextual derivation.

## 2023-10-11 NOTE — Assessment & Plan Note (Signed)
 D/c Prozac    Start Cymbalta  30 mg qd

## 2023-10-11 NOTE — Nursing Note (Signed)
 Patient presents with a follow up for medication refills

## 2023-10-11 NOTE — Nursing Note (Signed)
 One month follow up med refill

## 2023-10-11 NOTE — Assessment & Plan Note (Signed)
 On chronic pain management  Continue Norco 7.5mg  q 8 hrs-refills given

## 2023-11-07 ENCOUNTER — Ambulatory Visit (INDEPENDENT_AMBULATORY_CARE_PROVIDER_SITE_OTHER): Payer: Self-pay | Admitting: Physician Assistant

## 2023-11-09 ENCOUNTER — Other Ambulatory Visit (INDEPENDENT_AMBULATORY_CARE_PROVIDER_SITE_OTHER): Payer: Self-pay | Admitting: Physician Assistant

## 2023-11-09 ENCOUNTER — Encounter (INDEPENDENT_AMBULATORY_CARE_PROVIDER_SITE_OTHER): Payer: Self-pay | Admitting: Physician Assistant

## 2023-11-09 ENCOUNTER — Ambulatory Visit: Payer: Self-pay | Attending: Physician Assistant | Admitting: Physician Assistant

## 2023-11-09 ENCOUNTER — Other Ambulatory Visit: Payer: Self-pay

## 2023-11-09 VITALS — BP 134/88 | HR 70 | Wt 177.0 lb

## 2023-11-09 DIAGNOSIS — M109 Gout, unspecified: Secondary | ICD-10-CM | POA: Insufficient documentation

## 2023-11-09 DIAGNOSIS — Z23 Encounter for immunization: Secondary | ICD-10-CM | POA: Insufficient documentation

## 2023-11-09 DIAGNOSIS — M545 Low back pain, unspecified: Secondary | ICD-10-CM | POA: Insufficient documentation

## 2023-11-09 DIAGNOSIS — G8929 Other chronic pain: Secondary | ICD-10-CM | POA: Insufficient documentation

## 2023-11-09 DIAGNOSIS — R7303 Prediabetes: Secondary | ICD-10-CM | POA: Insufficient documentation

## 2023-11-09 DIAGNOSIS — F419 Anxiety disorder, unspecified: Secondary | ICD-10-CM | POA: Insufficient documentation

## 2023-11-09 DIAGNOSIS — E782 Mixed hyperlipidemia: Secondary | ICD-10-CM | POA: Insufficient documentation

## 2023-11-09 DIAGNOSIS — I10 Essential (primary) hypertension: Secondary | ICD-10-CM | POA: Insufficient documentation

## 2023-11-09 DIAGNOSIS — R7989 Other specified abnormal findings of blood chemistry: Secondary | ICD-10-CM | POA: Insufficient documentation

## 2023-11-09 DIAGNOSIS — F32A Depression, unspecified: Secondary | ICD-10-CM | POA: Insufficient documentation

## 2023-11-09 LAB — COMPREHENSIVE METABOLIC PNL, FASTING
ALBUMIN/GLOBULIN RATIO: 1.6 — ABNORMAL HIGH (ref 0.8–1.4)
ALBUMIN: 4.3 g/dL (ref 3.5–5.7)
ALKALINE PHOSPHATASE: 43 U/L (ref 34–104)
ALT (SGPT): 22 U/L (ref 7–52)
ANION GAP: 7 mmol/L (ref 4–13)
AST (SGOT): 31 U/L (ref 13–39)
BILIRUBIN TOTAL: 0.6 mg/dL (ref 0.3–1.0)
BUN/CREA RATIO: 15 (ref 6–22)
BUN: 13 mg/dL (ref 7–25)
CALCIUM, CORRECTED: 8.8 mg/dL — ABNORMAL LOW (ref 8.9–10.8)
CALCIUM: 9 mg/dL (ref 8.6–10.3)
CHLORIDE: 105 mmol/L (ref 98–107)
CO2 TOTAL: 25 mmol/L (ref 21–31)
CREATININE: 0.88 mg/dL (ref 0.60–1.30)
ESTIMATED GFR: 93 mL/min/1.73mˆ2 (ref >59)
GLOBULIN: 2.7 (ref 2.0–3.5)
GLUCOSE: 97 mg/dL (ref 74–109)
OSMOLALITY, CALCULATED: 274 mosm/kg (ref 270–290)
POTASSIUM: 3.9 mmol/L (ref 3.5–5.1)
PROTEIN TOTAL: 7 g/dL (ref 6.4–8.9)
SODIUM: 137 mmol/L (ref 136–145)

## 2023-11-09 LAB — LIPID PANEL
CHOL/HDL RATIO: 3.1
CHOLESTEROL: 143 mg/dL (ref <200)
HDL CHOL: 46 mg/dL (ref >=40)
LDL CALC: 81 mg/dL (ref 0–100)
TRIGLYCERIDES: 81 mg/dL (ref <=150)
VLDL CALC: 16 mg/dL (ref 0–50)

## 2023-11-09 LAB — CBC WITH DIFF
BASOPHIL #: 0 x10ˆ3/uL (ref 0.00–0.10)
BASOPHIL %: 0 % (ref 0–1)
EOSINOPHIL #: 0.2 x10ˆ3/uL (ref 0.00–0.60)
EOSINOPHIL %: 3 % (ref 1–8)
HCT: 43.5 % (ref 36.7–47.1)
HGB: 14.7 g/dL (ref 12.5–16.3)
LYMPHOCYTE #: 3.1 x10ˆ3/uL — ABNORMAL HIGH (ref 1.00–3.00)
LYMPHOCYTE %: 39 % (ref 15–43)
MCH: 28.7 pg (ref 23.8–33.4)
MCHC: 33.8 g/dL (ref 32.5–36.3)
MCV: 85 fL (ref 73.0–96.2)
MONOCYTE #: 0.7 x10ˆ3/uL (ref 0.30–1.10)
MONOCYTE %: 9 % (ref 6–14)
MPV: 10.4 fL (ref 7.4–11.4)
NEUTROPHIL #: 3.9 x10ˆ3/uL (ref 1.85–7.84)
NEUTROPHIL %: 49 % (ref 44–74)
PLATELETS: 201 x10ˆ3/uL (ref 140–440)
RBC: 5.11 x10ˆ6/uL (ref 4.06–5.63)
RDW: 13.4 % (ref 12.1–16.2)
WBC: 8 x10ˆ3/uL (ref 3.6–10.2)

## 2023-11-09 LAB — URIC ACID: URIC ACID: 7.4 mg/dL (ref 2.3–7.6)

## 2023-11-09 LAB — THYROID STIMULATING HORMONE WITH FREE T4 REFLEX: TSH: 0.779 u[IU]/mL (ref 0.450–5.330)

## 2023-11-09 LAB — HGA1C (HEMOGLOBIN A1C WITH EST AVG GLUCOSE): HEMOGLOBIN A1C: 5.8 % (ref 4.0–6.0)

## 2023-11-09 MED ORDER — HYDROCODONE 7.5 MG-ACETAMINOPHEN 325 MG TABLET
1.0000 | ORAL_TABLET | Freq: Three times a day (TID) | ORAL | 0 refills | Status: DC | PRN
Start: 2023-11-09 — End: 2023-12-05

## 2023-11-09 MED ORDER — COLCHICINE 0.6 MG TABLET
0.6000 mg | ORAL_TABLET | Freq: Every day | ORAL | 1 refills | Status: AC
Start: 2023-11-09 — End: ?

## 2023-11-09 MED ORDER — CLONAZEPAM 1 MG TABLET
1.0000 mg | ORAL_TABLET | Freq: Every evening | ORAL | 2 refills | Status: DC
Start: 2023-11-09 — End: 2023-12-18

## 2023-11-09 NOTE — Assessment & Plan Note (Signed)
 Stable on klonopin 1mg - refills given

## 2023-11-09 NOTE — Assessment & Plan Note (Signed)
Continue Prozac 10 mg qd

## 2023-11-09 NOTE — Assessment & Plan Note (Signed)
-  Controlled on prinivil  10 mg qd  -Continue low na diet/wt management  -continue BP monitoring

## 2023-11-09 NOTE — Assessment & Plan Note (Addendum)
-  no current gout sx  -continue colchicine  prn  -continue low uric acid diet

## 2023-11-09 NOTE — Assessment & Plan Note (Signed)
 On chronic pain management  Continue Norco 7.5mg  q 8 hrs-refills given

## 2023-11-09 NOTE — Telephone Encounter (Signed)
 Patient presents for a 70mo follow up and medication refill

## 2023-11-09 NOTE — Assessment & Plan Note (Signed)
-  last A1C 5.6  -Labs obtained in office in f/u

## 2023-11-09 NOTE — Progress Notes (Signed)
 [facility]  [address]  [place] [address]          Follow Up        Reason for Visit: Follow Up and Blood Work     History of Present Illness  Gregory Case is a 68 y.o. male who is being seen today in office for blood pressure/hypertension with reading today 134/88.  Pt states he feels well and denies any headaches dizziness chest pain. Continues to take prinivil  10 mg po daily.    F/u elevated glucose/prediabetes w/o med use. Pt tries to watch sugar in diet but does not check BS outpt currently. Last A1C 5.6. No increased urination thirst noted.  F/u labs needed.     Follow-up cholesterol/lipids with pt currently taking Zocor  40 mg nightly. He continues to watch as well as exercises.  No side effects of medication noted and follow-up labs needed.    F/U chronic anxiety/depresion w pt on prozac  10 mg daily as well as clonazepam  1mg  prn. Pt states he is doing well at this time and denies any panic attacks or increased anxiety/depression sx.     Follow-up chronic back pain/DJD/OA for which patient has been on long-term narcotic use for multiple years tolerated ;hx of TKR noted in past and most recently started having increased left lower back pain w radiculopathy of left leg. [facility] and received injection with pain much better since. Ortho eval noted:    Encounter Date: 02/05/2023  Note Received: 02/06/23 1043  Orders   Spine Injection: L5-S1     Post-Procedure Diagnoses   Lumbar disc herniation with radiculopathy             Procedures:    ILESI L5-S1      Performed by: Darlis Coy, MD  Authorized by: Darlis Coy, MD          Consent Given by:  Patient  Site marked: the procedure site was marked    Timeout: prior to procedure the correct patient, procedure, and site was verified    Preparation:  Skin prepped with Hibiclens  Procedure:  ILESI  Indications:  Therapeutic benefit  Guidance:  Fluoroscopy  Levels:  L5-S1  Site(s):     Pt once  again w arthritic pains in the hands knees and sometimes between dosing of Norco he uses motrin. He also has hx of gout however no red hot inflammed joints noted.  Request medication refills  today and no abuse history noted and patient  obtained his urine drug screen today.    +hx of low TSH level noted in the past however pt is not on thyroid  med. Last level noted to be WNL and will recheck today.    Colonoscopy up-to-date; will be due again in 2026.      PHQ Questionnaire         Past Medical History:   Diagnosis Date    Abnormal glucose level     Anxiety     Chronic back pain     COVID-19 vaccine series completed     Depression     Mixed hyperlipidemia     Osteoarthritis          Past Surgical History:   Procedure Laterality Date    COLONOSCOPY W/ POLYPECTOMY      HX BACK SURGERY      HX CARPAL TUNNEL RELEASE      HX KNEE REPLACMENT Right  HX TONSILLECTOMY        Family Medical History:       Problem Relation (Age of Onset)    Coronary Artery Disease Mother    Diabetes type II Mother    Hypertension (High Blood Pressure) Other            Social History     Tobacco Use    Smoking status: Never    Smokeless tobacco: Never   Substance Use Topics    Alcohol use: Yes     Comment: few times a week    Drug use: Never     Medication:  clonazePAM  (KLONOPIN ) 1 mg Oral Tablet, Take 1 Tablet (1 mg total) by mouth Every night  colchicine  0.6 mg Oral Tablet, Take 1 Tablet (0.6 mg total) by mouth Daily  DULoxetine  (CYMBALTA  DR) 30 mg Oral Capsule, Delayed Release(E.C.), Take 1 Capsule (30 mg total) by mouth Daily  HYDROcodone -acetaminophen  (NORCO) 7.5-325 mg Oral Tablet, Take 1 Tablet by mouth Every 8 hours as needed for Pain for up to 29 days  lisinopriL  (PRINIVIL ) 10 mg Oral Tablet, Take 1 Tablet (10 mg total) by mouth Daily  simvastatin  (ZOCOR ) 40 mg Oral Tablet, Take 1 Tablet (40 mg total) by mouth Every night  tiZANidine  (ZANAFLEX ) 4 mg Oral Tablet, Take 1 Tablet (4 mg total) by mouth Three times a day as needed    No  facility-administered medications prior to visit.    Allergies:  Allergies   Allergen Reactions    Meloxicam Hives/ Urticaria       Physical Exam:  Vitals:    11/09/23 0824   Pulse: 70   SpO2: 99%   Weight: 80.3 kg (177 lb)         Vitals reviewed  General appearance: alert, cooperative, in no acute distress  HEENT: Ears: clear, no effusion, no erythema; Throat: non-erythematous; no LAD, neck supple, moist mucus membranes  Lungs: clear to auscultation bilaterally; no wheezes or rhonchi   Heart: regular rate and rhythm; normal S1 & S2; no murmur  Abdomen: soft, non-tender, not distended; bowel sounds present  Extremities: extremities normal ROM, +arthritic changes bilat hands/knees w hx of previous RT TKR - scar noted  +straight leg raise left leg at 85 degrees  Gait stable  no cyanosis or edema , no rash  +purpura bilat upper extrem  Psych: alert and oriented x 3  Neuro: CN 2-12 grossly intact  Assessment & Plan  Essential hypertension  -Controlled on prinivil  10 mg qd  -Continue low na diet/wt management  -continue BP monitoring  Prediabetes  -last A1C 5.6  -Labs obtained in office in f/u  Mixed hyperlipidemia  -continue Zocor  40 mg q hs  -continue diet wt management  -Labs obtained today to f/u on chol levels  Anxiety  -Stable on klonopin  1mg - refills given  Depression, unspecified depression type  -Continue Prozac  10 mg qd  Chronic low back pain, unspecified back pain laterality, unspecified whether sciatica present  -On chronic pain management  -Continue Norco 7.5mg  q 8 hrs- refills given  Gout, unspecified cause, unspecified chronicity, unspecified site  -no current gout sx  -continue colchicine  prn  -continue low uric acid diet  Low TSH level  -will check TSH level today in lab    Orders Placed This Encounter    CBC/DIFF    COMPREHENSIVE METABOLIC PNL, FASTING    HGA1C (HEMOGLOBIN A1C WITH EST AVG GLUCOSE)    URINALYSIS, MACROSCOPIC AND MICROSCOPIC W/CULTURE REFLEX    LIPID  PANEL    MICROALBUMIN/CREATININE  RATIO, URINE, RANDOM    THYROID  STIMULATING HORMONE WITH FREE T4 REFLEX    URIC ACID       Post-Discharge Follow Up Appointments       Monday Dec 10, 2023    Return Patient Visit with Scharlene No, PA-C at  8:30 AM      Tuesday Jan 08, 2024    Return Patient Visit with Scharlene No, PA-C at  8:00 AM      Tuesday Feb 05, 2024    Return Patient Visit with Scharlene No, PA-C at  8:00 AM      Monday Jun 02, 2024    Return Telephone Visit with Scharlene No, PA-C at  2:00 PM      [facility]  [facility]  [address]  [place] [address]  [phone]               Seek medical attention for new or worsening symptoms.  Patient has been seen in this clinic within the last 3 years.     Jazzalynn Rhudy, PA-C          This note was partially created using MModal Fluency Direct system (voice recognition software) and is inherently subject to errors including those of syntax and sound-alike substitutions which may escape proofreading.  In such instances, original meaning may be extrapolated by contextual derivation.

## 2023-11-09 NOTE — Assessment & Plan Note (Signed)
-  continue Zocor  40 mg q hs  -continue diet wt management  -Labs obtained today to f/u on chol levels

## 2023-11-12 ENCOUNTER — Ambulatory Visit (INDEPENDENT_AMBULATORY_CARE_PROVIDER_SITE_OTHER): Payer: Self-pay | Admitting: Physician Assistant

## 2023-11-13 ENCOUNTER — Ambulatory Visit (INDEPENDENT_AMBULATORY_CARE_PROVIDER_SITE_OTHER): Payer: Self-pay

## 2023-12-05 ENCOUNTER — Other Ambulatory Visit (INDEPENDENT_AMBULATORY_CARE_PROVIDER_SITE_OTHER): Payer: Self-pay | Admitting: Physician Assistant

## 2023-12-05 ENCOUNTER — Encounter (INDEPENDENT_AMBULATORY_CARE_PROVIDER_SITE_OTHER): Payer: Self-pay | Admitting: Physician Assistant

## 2023-12-05 ENCOUNTER — Other Ambulatory Visit: Payer: Self-pay

## 2023-12-05 ENCOUNTER — Ambulatory Visit: Payer: Self-pay | Attending: Physician Assistant | Admitting: Physician Assistant

## 2023-12-05 VITALS — BP 132/84 | HR 86 | Ht 65.0 in | Wt 178.2 lb

## 2023-12-05 DIAGNOSIS — G8929 Other chronic pain: Secondary | ICD-10-CM | POA: Insufficient documentation

## 2023-12-05 DIAGNOSIS — E782 Mixed hyperlipidemia: Secondary | ICD-10-CM | POA: Insufficient documentation

## 2023-12-05 DIAGNOSIS — R7303 Prediabetes: Secondary | ICD-10-CM | POA: Insufficient documentation

## 2023-12-05 DIAGNOSIS — F419 Anxiety disorder, unspecified: Secondary | ICD-10-CM | POA: Insufficient documentation

## 2023-12-05 DIAGNOSIS — M545 Low back pain, unspecified: Secondary | ICD-10-CM | POA: Insufficient documentation

## 2023-12-05 DIAGNOSIS — F32A Depression, unspecified: Secondary | ICD-10-CM | POA: Insufficient documentation

## 2023-12-05 DIAGNOSIS — Z23 Encounter for immunization: Secondary | ICD-10-CM | POA: Insufficient documentation

## 2023-12-05 DIAGNOSIS — I10 Essential (primary) hypertension: Secondary | ICD-10-CM | POA: Insufficient documentation

## 2023-12-05 LAB — URINALYSIS, MICROSCOPIC
RBCS: <1 /HPF (ref <4)
WBCS: <1 /HPF (ref <6)

## 2023-12-05 LAB — URINALYSIS, MACROSCOPIC
BILIRUBIN: NEGATIVE mg/dL
BLOOD: NEGATIVE mg/dL
GLUCOSE: NEGATIVE mg/dL
KETONES: NEGATIVE mg/dL
LEUKOCYTES: NEGATIVE WBCs/uL
NITRITE: NEGATIVE
PH: 7.5 (ref 5.0–9.0)
PROTEIN: NEGATIVE mg/dL
SPECIFIC GRAVITY: 1.012 (ref 1.002–1.030)
UROBILINOGEN: NORMAL mg/dL

## 2023-12-05 LAB — MICROALBUMIN/CREATININE RATIO, URINE, RANDOM
CREATININE RANDOM URINE: 99 mg/dL (ref 30–125)
MICROALBUMIN RANDOM URINE: <0.7 mg/dL

## 2023-12-05 MED ORDER — DULOXETINE 60 MG CAPSULE,DELAYED RELEASE
60.0000 mg | DELAYED_RELEASE_CAPSULE | Freq: Every day | ORAL | 1 refills | Status: DC
Start: 2023-12-05 — End: 2023-12-13

## 2023-12-05 MED ORDER — SIMVASTATIN 40 MG TABLET: 40.0000 mg | ORAL_TABLET | Freq: Every evening | ORAL | 1 refills | Status: DC

## 2023-12-05 MED ORDER — LISINOPRIL 10 MG TABLET
10.0000 mg | ORAL_TABLET | Freq: Every day | ORAL | 1 refills | Status: AC
Start: 2023-12-05 — End: ?

## 2023-12-05 NOTE — Telephone Encounter (Signed)
 1 month follow up for medication refills.

## 2023-12-05 NOTE — Assessment & Plan Note (Signed)
 Mixed hyperlipidemia well-controlled with total cholesterol of 143 and LDL of 81.  -continue Zocor  40 mg q hs

## 2023-12-05 NOTE — Assessment & Plan Note (Signed)
 Prediabetes well-managed with A1c of 5.8, improved from 6.2.  - Continue current lifestyle modifications including diet and exercise.

## 2023-12-05 NOTE — Assessment & Plan Note (Signed)
 Depression and anxiety managed with duloxetine . Increased dose to 60 mg for symptom improvement and additional pain relief.  - Increase duloxetine  (Cymbalta ) to 60 mg daily.  -continue klonopin  nightly

## 2023-12-05 NOTE — Assessment & Plan Note (Signed)
 Depression and anxiety managed with duloxetine . Increased dose to 60 mg for symptom improvement and additional pain relief.  - Increase duloxetine  (Cymbalta ) to 60 mg daily.

## 2023-12-05 NOTE — Progress Notes (Signed)
 [facility]  [address]  [place] [address]          Follow Up        Reason for Visit: Follow Up (1 month follow up)     This note was created with assistance from [employer] via capture of conversational audio.  Consent was obtained from the patient prior to recording.      History of Present Illness  Gregory Case is a 68 year old male who presents for medication management and routine follow-up.    Last visit medication adjustment made due to increased  anxiety/depression/irritability sx. Pt had been taking prozac  10 mg daily for some time and felt med no longer was helping so switched to Cymbalta  30 mg daily. Discussed slow increase in med as tolerated to see if would help w his chronic pain as well as depression/anxiety. He continues to take his  clonazepam  1mg  prn for his anxiety as well as sleep. Pt states the new med has shown a sig improvement in his sx and he has had no issues w med so agreeable to increasing dose.     F/u pressure/hypertension with reading today 132/84.  Pt states he feels well and denies any headaches dizziness chest pain. Continues to take prinivil  10 mg po daily.    F/u elevated glucose/prediabetes w/o med use. Pt tries to watch sugar in diet but does not check BS outpt currently. Last A1C 5.8. No increased urination thirst noted.      F/u cholesterol w recent labs noting chol 143 and triglycerides were 81, indicating good control. He takes Zocor  for cholesterol management at night.    Follow-up chronic back pain/DJD/OA for which patient has been on long-term narcotic use for multiple years tolerated. Hx of TKR noted in past as well as radiculopathy of left leg. [facility] and received injection with pain much better since. Ortho eval noted:    Encounter Date: 02/05/2023  Note Received: 02/06/23 1043  Orders   Spine Injection: L5-S1     Post-Procedure Diagnoses   Lumbar disc herniation with radiculopathy              Procedures:    ILESI L5-S1      Performed by: Darlis Coy, MD  Authorized by: Darlis Coy, MD          Consent Given by:  Patient  Site marked: the procedure site was marked    Timeout: prior to procedure the correct patient, procedure, and site was verified    Preparation:  Skin prepped with Hibiclens  Procedure:  ILESI  Indications:  Therapeutic benefit  Guidance:  Fluoroscopy  Levels:  L5-S1  Site(s):     Pt once again w arthritic pains in the hands knees and sometimes between dosing of Norco he uses motrin. He also has hx of gout however no red hot inflammed joints noted.  Request medication refills  today and no abuse history noted and patient  obtained his urine drug screen today.    +hx of low TSH level noted in the past however pt is not on thyroid  med. Last level noted to be WNL and will recheck today.    Colonoscopy up-to-date; will be due again in 2026.    Flu shot needed    PHQ Questionnaire         Past Medical History:   Diagnosis Date    Abnormal glucose level     Anxiety  Chronic back pain     COVID-19 vaccine series completed     Depression     Mixed hyperlipidemia     Osteoarthritis          Past Surgical History:   Procedure Laterality Date    COLONOSCOPY W/ POLYPECTOMY      HX BACK SURGERY      HX CARPAL TUNNEL RELEASE      HX KNEE REPLACMENT Right     HX TONSILLECTOMY        Family Medical History:       Problem Relation (Age of Onset)    Coronary Artery Disease Mother    Diabetes type II Mother    Hypertension (High Blood Pressure) Other            Social History     Tobacco Use    Smoking status: Never    Smokeless tobacco: Never   Substance Use Topics    Alcohol use: Yes     Comment: few times a week    Drug use: Never     Medication:  clonazePAM  (KLONOPIN ) 1 mg Oral Tablet, Take 1 Tablet (1 mg total) by mouth Every night  colchicine  0.6 mg Oral Tablet, Take 1 Tablet (0.6 mg total) by mouth Daily  HYDROcodone -acetaminophen  (NORCO) 7.5-325 mg Oral Tablet, Take 1 Tablet by mouth  Every 8 hours as needed for Pain for up to 29 days  DULoxetine  (CYMBALTA  DR) 30 mg Oral Capsule, Delayed Release(E.C.), Take 1 Capsule (30 mg total) by mouth Daily  lisinopriL  (PRINIVIL ) 10 mg Oral Tablet, Take 1 Tablet (10 mg total) by mouth Daily  simvastatin  (ZOCOR ) 40 mg Oral Tablet, Take 1 Tablet (40 mg total) by mouth Every night  tiZANidine  (ZANAFLEX ) 4 mg Oral Tablet, Take 1 Tablet (4 mg total) by mouth Three times a day as needed    No facility-administered medications prior to visit.    Allergies:  Allergies   Allergen Reactions    Meloxicam Hives/ Urticaria       Physical Exam:  Vitals:    12/05/23 0918   BP: 132/84   Pulse: 86   SpO2: 96%   Weight: 80.8 kg (178 lb 3.2 oz)   Height: 1.651 m (5' 5)   BMI: 29.65       Vitals reviewed  General appearance: alert, cooperative, in no acute distress  HEENT: Ears: clear, no effusion, no erythema; Throat: non-erythematous; no LAD, neck supple, moist mucus membranes  Lungs: clear to auscultation bilaterally; no wheezes or rhonchi   Heart: regular rate and rhythm; normal S1 & S2; no murmur  Abdomen: soft, non-tender, not distended; bowel sounds present  Extremities: extremities normal ROM, +arthritic changes bilat hands/knees w hx of previous RT TKR - scar noted  +straight leg raise left leg at 85 degrees  Gait stable  no cyanosis or edema , no rash  +purpura bilat upper extrem  Psych: alert and oriented x 3  Neuro: CN 2-12 grossly intact  Assessment & Plan  Depression, unspecified depression type  Depression and anxiety managed with duloxetine . Increased dose to 60 mg for symptom improvement and additional pain relief.  - Increase duloxetine  (Cymbalta ) to 60 mg daily.  Anxiety  Depression and anxiety managed with duloxetine . Increased dose to 60 mg for symptom improvement and additional pain relief.  - Increase duloxetine  (Cymbalta ) to 60 mg daily.  -continue klonopin  nightly  Essential hypertension  HTN w BP control  -continue prinivil  10 mg  qd  Prediabetes  Prediabetes well-managed with A1c of 5.8, improved from 6.2.  - Continue current lifestyle modifications including diet and exercise.  Mixed hyperlipidemia    Mixed hyperlipidemia well-controlled with total cholesterol of 143 and LDL of 81.  -continue Zocor  40 mg q hs  Chronic low back pain, unspecified back pain laterality, unspecified whether sciatica present  Chronic low back pain persists.  - Continue hydrocodone -acetaminophen  as needed for pain management.  Influenza vaccination administered at current visit  >65 influenza vac given     Orders Placed This Encounter    Flu Vaccine(FLUAD),65+,0.70mL IM (Admin)    lisinopriL  (PRINIVIL ) 10 mg Oral Tablet    DULoxetine  (CYMBALTA  DR) 60 mg Oral Capsule, Delayed Release(E.C.)    simvastatin  (ZOCOR ) 40 mg Oral Tablet       Post-Discharge Follow Up Appointments       Tuesday Jan 08, 2024    Return Patient Visit with Scharlene No, PA-C at  8:00 AM      Tuesday Feb 05, 2024    Return Patient Visit with Scharlene No, PA-C at  8:00 AM      Monday Jun 02, 2024    Return Telephone Visit with Scharlene No, PA-C at  2:00 PM      [facility]  [facility] [place]  [address]  [place] [address]  [phone]           I personally reviewed the documentation of care provided by the certified physician assistant and I agree with her medical decision making, Jhonny Romano, D.O.       Seek medical attention for new or worsening symptoms.  Patient has been seen in this clinic within the last 3 years.     Amy Alvis, PA-C          This note was partially created using MModal Fluency Direct system (voice recognition software) and is inherently subject to errors including those of syntax and sound-alike substitutions which may escape proofreading.  In such instances, original meaning may be extrapolated by contextual derivation.

## 2023-12-05 NOTE — Assessment & Plan Note (Signed)
 HTN w BP control  -continue prinivil  10 mg qd

## 2023-12-05 NOTE — Assessment & Plan Note (Signed)
 Chronic low back pain persists.  - Continue hydrocodone -acetaminophen  as needed for pain management.

## 2023-12-05 NOTE — Nursing Note (Signed)
 1 month follow up

## 2023-12-07 MED ORDER — HYDROCODONE 7.5 MG-ACETAMINOPHEN 325 MG TABLET
1.0000 | ORAL_TABLET | Freq: Three times a day (TID) | ORAL | 0 refills | Status: DC | PRN
Start: 2023-12-07 — End: 2024-01-09

## 2023-12-10 ENCOUNTER — Ambulatory Visit (INDEPENDENT_AMBULATORY_CARE_PROVIDER_SITE_OTHER): Payer: Self-pay | Admitting: Physician Assistant

## 2023-12-13 ENCOUNTER — Other Ambulatory Visit (INDEPENDENT_AMBULATORY_CARE_PROVIDER_SITE_OTHER): Payer: Self-pay | Admitting: Physician Assistant

## 2023-12-13 MED ORDER — DULOXETINE 60 MG CAPSULE,DELAYED RELEASE
60.0000 mg | DELAYED_RELEASE_CAPSULE | Freq: Every day | ORAL | 1 refills | Status: AC
Start: 2023-12-13 — End: ?

## 2023-12-18 ENCOUNTER — Other Ambulatory Visit (INDEPENDENT_AMBULATORY_CARE_PROVIDER_SITE_OTHER): Payer: Self-pay | Admitting: Physician Assistant

## 2023-12-18 MED ORDER — CLONAZEPAM 1 MG TABLET: 1.0000 mg | ORAL_TABLET | Freq: Every evening | ORAL | 2 refills | Status: DC

## 2024-01-08 ENCOUNTER — Encounter (INDEPENDENT_AMBULATORY_CARE_PROVIDER_SITE_OTHER): Payer: Self-pay | Admitting: Physician Assistant

## 2024-01-08 ENCOUNTER — Ambulatory Visit: Payer: Self-pay | Attending: Physician Assistant | Admitting: Physician Assistant

## 2024-01-08 ENCOUNTER — Other Ambulatory Visit (INDEPENDENT_AMBULATORY_CARE_PROVIDER_SITE_OTHER): Payer: Self-pay | Admitting: Physician Assistant

## 2024-01-08 ENCOUNTER — Other Ambulatory Visit: Payer: Self-pay

## 2024-01-08 VITALS — BP 128/70 | HR 84 | Wt 179.0 lb

## 2024-01-08 DIAGNOSIS — M549 Dorsalgia, unspecified: Secondary | ICD-10-CM | POA: Insufficient documentation

## 2024-01-08 DIAGNOSIS — I10 Essential (primary) hypertension: Secondary | ICD-10-CM | POA: Insufficient documentation

## 2024-01-08 DIAGNOSIS — T148XXA Other injury of unspecified body region, initial encounter: Secondary | ICD-10-CM | POA: Insufficient documentation

## 2024-01-08 DIAGNOSIS — G8929 Other chronic pain: Secondary | ICD-10-CM | POA: Insufficient documentation

## 2024-01-08 DIAGNOSIS — M25511 Pain in right shoulder: Secondary | ICD-10-CM

## 2024-01-08 DIAGNOSIS — M25512 Pain in left shoulder: Secondary | ICD-10-CM

## 2024-01-08 DIAGNOSIS — F32A Depression, unspecified: Secondary | ICD-10-CM | POA: Insufficient documentation

## 2024-01-08 DIAGNOSIS — M545 Low back pain, unspecified: Secondary | ICD-10-CM | POA: Insufficient documentation

## 2024-01-08 MED ORDER — COLCHICINE 0.6 MG TABLET: 0.6000 mg | ORAL_TABLET | Freq: Every day | ORAL | 1 refills | Status: DC

## 2024-01-08 MED ORDER — LISINOPRIL 10 MG TABLET: 10.0000 mg | ORAL_TABLET | Freq: Every day | ORAL | 1 refills | Status: DC

## 2024-01-08 MED ORDER — DULOXETINE 60 MG CAPSULE,DELAYED RELEASE: 60.0000 mg | DELAYED_RELEASE_CAPSULE | Freq: Every day | ORAL | 1 refills | Status: DC

## 2024-01-08 MED ORDER — PREDNISONE 20 MG TABLET
20.0000 mg | ORAL_TABLET | Freq: Two times a day (BID) | ORAL | 0 refills | Status: AC
Start: 2024-01-08 — End: 2024-01-13

## 2024-01-08 MED ORDER — METHYLPREDNISOLONE SOD SUCC 125 MG SOLUTION FOR INJECTION WRAPPER
125.0000 mg | Freq: Once | INTRAVENOUS | Status: AC
Start: 2024-01-08 — End: 2024-01-08
  Administered 2024-01-08: 125 mg via INTRAMUSCULAR

## 2024-01-08 MED ORDER — KETOROLAC 30 MG/ML (1 ML) INJECTION SOLUTION
30.0000 mg | Freq: Once | INTRAMUSCULAR | Status: AC
Start: 2024-01-08 — End: 2024-01-08
  Administered 2024-01-08: 30 mg via INTRAMUSCULAR

## 2024-01-08 MED ORDER — CYCLOBENZAPRINE 10 MG TABLET
10.0000 mg | ORAL_TABLET | Freq: Three times a day (TID) | ORAL | 0 refills | Status: AC
Start: 2024-01-08 — End: 2024-01-18

## 2024-01-08 NOTE — Progress Notes (Signed)
 INTERNAL MEDICINE, BUILDING A  [address]  [place] [address]          Follow Up        Reason for Visit: Follow Up and Medication Refill     This note was created with assistance from Abridge via capture of conversational audio.  Consent was obtained from the patient prior to recording.      History of Present Illness  Gregory Case is a 68 year old male who presents for follow-up however having issues with upper back pain following physical exertion.    He has been experiencing pain between his shoulder blades for three days, which began after power washing his house. The pain is localized to the upper back, specifically between the shoulder blades, and is exacerbated by lifting his arms. No chest pain or radiation of pain down his arms.    He has been taking his normal pain medication and a muscle relaxer, Zanaflex , which is actually his wife's prescription and is about a year old. He also took a CBD gummy last night so wife states he has been a little drowsy this morning so she drove him here.  She is also going to take him to the chiropractor once daily the office this morning. He has a history of chronic lower back issues and was scheduled to meet a new back doctor at [facility]  due to the unavailability of his previous doctor who administered injections however missed the appointment due to his current issues.    Wife also states patient  is in the process of scheduling a knee revision surgery with the orthopedic in [place] , having consulted multiple doctors to find a suitable careers adviser.      Follow-up blood pressure/hypertension with reading today 128/70.  Pt states he feels well and denies any headaches dizziness chest pain. Continues to take prinivil  10 mg po daily.    F/U chronic anxiety/depresion w pt is now taking Cymbalta  60 mg daily in place of previously use Prozac .  Cymbalta  seems to be working well for his depression as well as helping some with the pain.  He  has also on clonazepam  1mg  prn in the evenings for anxiety as well as to help him rest.  Pt states he is doing well at this time and denies any panic attacks or increased anxiety/depression sx.     Follow-up chronic back pain/DJD/OA for which patient has been on long-term narcotic use for multiple years tolerated ;hx of TKR right knee noted with need for revision due to increased pain also noted.  He also has chronic arthritic pains in the hands knees and sometimes between dosing of Norco he uses motrin. He also has hx of gout however no red hot inflammed joints noted.  Request medication refills.      PHQ Questionnaire  Little interest or pleasure in doing things.: Not at all  Feeling down, depressed, or hopeless: Not at all  PHQ 2 Total: 0  Trouble falling or staying asleep, or sleeping too much.: Not at all  Feeling tired or having little energy: Not at all  Poor appetite or overeating: Not at all  Feeling bad about yourself/ that you are a failure in the past 2 weeks?: Not at all  Trouble concentrating on things in the past 2 weeks?: Not at all  Moving/Speaking slowly or being fidgety or restless  in the past 2 weeks?: Not at all  Thoughts that you would be better off DEAD, or  of hurting yourself in some way.: Not at all  If you checked off any problems, how difficult have these problems made it for you to do your work, take care of things at home, or get along with other people?: Not difficult at all  PHQ 9 Total: 0  Interpretation of Total Score: 0-4 No depression      Past Medical History:   Diagnosis Date    Abnormal glucose level     Anxiety     Chronic back pain     COVID-19 vaccine series completed     Depression     Mixed hyperlipidemia     Osteoarthritis          Past Surgical History:   Procedure Laterality Date    COLONOSCOPY W/ POLYPECTOMY      HX BACK SURGERY      HX CARPAL TUNNEL RELEASE      HX KNEE REPLACMENT Right     HX TONSILLECTOMY        Family Medical History:       Problem Relation (Age of  Onset)    Coronary Artery Disease Mother    Diabetes type II Mother    Hypertension (High Blood Pressure) Other            Social History     Tobacco Use    Smoking status: Never    Smokeless tobacco: Never   Substance Use Topics    Alcohol use: Yes     Comment: few times a week    Drug use: Never     Medication:  clonazePAM  (KLONOPIN ) 1 mg Oral Tablet, Take 1 Tablet (1 mg total) by mouth Every night  simvastatin  (ZOCOR ) 40 mg Oral Tablet, Take 1 Tablet (40 mg total) by mouth Every night  colchicine  0.6 mg Oral Tablet, Take 1 Tablet (0.6 mg total) by mouth Daily  DULoxetine  (CYMBALTA  DR) 60 mg Oral Capsule, Delayed Release(E.C.), Take 1 Capsule (60 mg total) by mouth Daily  lisinopriL  (PRINIVIL ) 10 mg Oral Tablet, Take 1 Tablet (10 mg total) by mouth Daily    No facility-administered medications prior to visit.    Allergies:  Allergies   Allergen Reactions    Meloxicam Hives/ Urticaria       Physical Exam:  Vitals:    01/08/24 0833   BP: 128/70   Pulse: 84   SpO2: 94%   Weight: 81.2 kg (179 lb)         Vitals reviewed  General appearance:  Patient is sleepy today however alert cooperative, in no acute distress  Lungs: clear to auscultation bilaterally; no wheezes or rhonchi   Heart: regular rate and rhythm; normal S1 & S2; no murmur  Abdomen: soft, non-tender, not distended; bowel sounds present  Extremities: extremities normal ROM, +arthritic changes bilat hands/knees w hx of previous RT TKR - scar noted  +straight leg raise left leg at 85 degrees  Gait stable  no cyanosis or edema , no rash  +purpura bilat upper extrem  + tenderness with muscle spasms noted left mid upper back just under the scapula area  Decreased range of motion bilateral shoulders due to pulling pain in the upper back area  Psych: alert and oriented x 3  Neuro: CN 2-12 grossly intact  Assessment & Plan  Upper back pain  Acute upper back and shoulder pain  Localized pain post-power washing, no radiation or chest pain. Zanaflex  outdated and  causes dizziness. Open to Toradol and steroid injections.  -  Administered Toradol injection for pain relief.  - Administered Solu-Medrol  injection for inflammation.  - Prescribed Flexeril 10 mg twice daily as Zanaflex  replacement.  - Initiate oral prednisone  20 mg b.i.d. x5 days tomorrow for anti-inflammatory effect.  - Continue chiropractic care and TENS unit use.  Muscle strain  Acute upper back and shoulder pain  Localized pain post-power washing, no radiation or chest pain. Zanaflex  outdated and causes dizziness. Open to Toradol and steroid injections.  - Administered Toradol injection for pain relief.  - Administered Solu-Medrol  injection for inflammation.  - Prescribed Flexeril 10 mg twice daily as Zanaflex  replacement.  - Initiate oral prednisone  20 mg b.i.d. x5 days tomorrow for anti-inflammatory effect.  - Continue chiropractic care and TENS unit use.  Essential hypertension  Hypertension controlled with use of Prinivil  10 mg daily.  -continue prinivil  10 mg qd  -Continue low na diet/wt management  -continue BP monitoring  Depression, unspecified depression type  Chronic depression stable with use of Cymbalta  60 mg daily.  -continue Cymbalta   Chronic low back pain, unspecified back pain laterality, unspecified whether sciatica present  Chronic lower back pain with a history of recent injections noted.  Continues following with neurosurgeon PRN.  Continues chronic pain management with the use of Norco.  -Continue Norco 7.5mg  q 8 hrs- refills given    Orders Placed This Encounter    colchicine  0.6 mg Oral Tablet    DULoxetine  (CYMBALTA  DR) 60 mg Oral Capsule, Delayed Release(E.C.)    lisinopriL  (PRINIVIL ) 10 mg Oral Tablet    ketorolac (TORADOL) 30 mg/mL injection    methylPREDNISolone  sod succ (SOLU-medrol ) 125 mg/2 mL injection    predniSONE  (DELTASONE ) 20 mg Oral Tablet    cyclobenzaprine (FLEXERIL) 10 mg Oral Tablet       Post-Discharge Follow Up Appointments       Tuesday Feb 05, 2024    Return Patient  Visit with Scharlene No, PA-C at  8:00 AM      Wednesday Mar 05, 2024    Return Patient Visit with Scharlene No, PA-C at  9:30 AM      Wednesday Apr 02, 2024    Return Patient Visit with Scharlene No, PA-C at  8:30 AM      Monday Jun 02, 2024    Return Telephone Visit with Scharlene No, PA-C at  2:00 PM      Internal Medicine, Building A  [facility]  [address]  [place] [address]  [phone]               Seek medical attention for new or worsening symptoms.  Patient has been seen in this clinic within the last 3 years.     Kimmie Berggren, PA-C          This note was partially created using MModal Fluency Direct system (voice recognition software) and is inherently subject to errors including those of syntax and sound-alike substitutions which may escape proofreading.  In such instances, original meaning may be extrapolated by contextual derivation.

## 2024-01-08 NOTE — Assessment & Plan Note (Signed)
 Chronic depression stable with use of Cymbalta  60 mg daily.  -continue Cymbalta 

## 2024-01-08 NOTE — Assessment & Plan Note (Signed)
 Hypertension controlled with use of Prinivil  10 mg daily.  -continue prinivil  10 mg qd  -Continue low na diet/wt management  -continue BP monitoring

## 2024-01-08 NOTE — Telephone Encounter (Signed)
 Patient presents for a 15mo follow up and medication refills

## 2024-01-08 NOTE — Assessment & Plan Note (Signed)
 Chronic lower back pain with a history of recent injections noted.  Continues following with neurosurgeon PRN.  Continues chronic pain management with the use of Norco.  -Continue Norco 7.5mg  q 8 hrs- refills given

## 2024-01-08 NOTE — Nursing Note (Signed)
 Patient presents for a 87mo follow up, medication refills.

## 2024-01-09 ENCOUNTER — Other Ambulatory Visit (INDEPENDENT_AMBULATORY_CARE_PROVIDER_SITE_OTHER): Payer: Self-pay | Admitting: Physician Assistant

## 2024-01-09 MED ORDER — HYDROCODONE 7.5 MG-ACETAMINOPHEN 325 MG TABLET: 1.0000 | ORAL_TABLET | Freq: Three times a day (TID) | ORAL | 0 refills | Status: DC | PRN

## 2024-01-09 NOTE — Telephone Encounter (Signed)
 Patient was seen yesterday for a 34mo follow up and medication refills.

## 2024-01-10 ENCOUNTER — Encounter (INDEPENDENT_AMBULATORY_CARE_PROVIDER_SITE_OTHER): Payer: Self-pay | Admitting: Physician Assistant

## 2024-02-05 ENCOUNTER — Encounter (INDEPENDENT_AMBULATORY_CARE_PROVIDER_SITE_OTHER): Payer: Self-pay | Admitting: Physician Assistant

## 2024-02-05 ENCOUNTER — Other Ambulatory Visit: Payer: Self-pay

## 2024-02-05 ENCOUNTER — Other Ambulatory Visit (INDEPENDENT_AMBULATORY_CARE_PROVIDER_SITE_OTHER): Payer: Self-pay | Admitting: Physician Assistant

## 2024-02-05 ENCOUNTER — Ambulatory Visit: Payer: Self-pay | Attending: Physician Assistant | Admitting: Physician Assistant

## 2024-02-05 VITALS — BP 122/80 | HR 90 | Temp 97.3°F | Ht 65.0 in | Wt 179.0 lb

## 2024-02-05 DIAGNOSIS — L309 Dermatitis, unspecified: Secondary | ICD-10-CM | POA: Insufficient documentation

## 2024-02-05 DIAGNOSIS — F419 Anxiety disorder, unspecified: Secondary | ICD-10-CM | POA: Insufficient documentation

## 2024-02-05 DIAGNOSIS — R7303 Prediabetes: Secondary | ICD-10-CM | POA: Insufficient documentation

## 2024-02-05 DIAGNOSIS — I10 Essential (primary) hypertension: Secondary | ICD-10-CM | POA: Insufficient documentation

## 2024-02-05 DIAGNOSIS — R9431 Abnormal electrocardiogram [ECG] [EKG]: Secondary | ICD-10-CM | POA: Insufficient documentation

## 2024-02-05 DIAGNOSIS — R0602 Shortness of breath: Secondary | ICD-10-CM | POA: Insufficient documentation

## 2024-02-05 DIAGNOSIS — M159 Polyosteoarthritis, unspecified: Secondary | ICD-10-CM | POA: Insufficient documentation

## 2024-02-05 DIAGNOSIS — E559 Vitamin D deficiency, unspecified: Secondary | ICD-10-CM | POA: Insufficient documentation

## 2024-02-05 DIAGNOSIS — M109 Gout, unspecified: Secondary | ICD-10-CM | POA: Insufficient documentation

## 2024-02-05 DIAGNOSIS — R799 Abnormal finding of blood chemistry, unspecified: Secondary | ICD-10-CM | POA: Insufficient documentation

## 2024-02-05 DIAGNOSIS — G8929 Other chronic pain: Secondary | ICD-10-CM | POA: Insufficient documentation

## 2024-02-05 DIAGNOSIS — R55 Syncope and collapse: Secondary | ICD-10-CM | POA: Insufficient documentation

## 2024-02-05 DIAGNOSIS — M545 Low back pain, unspecified: Secondary | ICD-10-CM | POA: Insufficient documentation

## 2024-02-05 DIAGNOSIS — N400 Enlarged prostate without lower urinary tract symptoms: Secondary | ICD-10-CM | POA: Insufficient documentation

## 2024-02-05 DIAGNOSIS — Z79899 Other long term (current) drug therapy: Secondary | ICD-10-CM | POA: Insufficient documentation

## 2024-02-05 DIAGNOSIS — E782 Mixed hyperlipidemia: Secondary | ICD-10-CM | POA: Insufficient documentation

## 2024-02-05 LAB — CBC WITH DIFF
BASOPHIL #: 0 x10ˆ3/uL (ref 0.00–0.10)
BASOPHIL %: 1 % (ref 0–1)
EOSINOPHIL #: 0.2 x10ˆ3/uL (ref 0.00–0.60)
EOSINOPHIL %: 4 % (ref 1–8)
HCT: 40.6 % (ref 36.7–47.1)
HGB: 14 g/dL (ref 12.5–16.3)
LYMPHOCYTE #: 2.2 x10ˆ3/uL (ref 1.00–3.00)
LYMPHOCYTE %: 32 % (ref 15–43)
MCH: 28.7 pg (ref 23.8–33.4)
MCHC: 34.5 g/dL (ref 32.5–36.3)
MCV: 83.2 fL (ref 73.0–96.2)
MONOCYTE #: 0.7 x10ˆ3/uL (ref 0.30–1.10)
MONOCYTE %: 11 % (ref 6–14)
MPV: 9.8 fL (ref 7.4–11.4)
NEUTROPHIL #: 3.8 x10ˆ3/uL (ref 1.85–7.84)
NEUTROPHIL %: 54 % (ref 44–74)
PLATELETS: 283 x10ˆ3/uL (ref 140–440)
RBC: 4.88 x10ˆ6/uL (ref 4.06–5.63)
RDW: 13.3 % (ref 12.1–16.2)
WBC: 7 x10ˆ3/uL (ref 3.6–10.2)

## 2024-02-05 LAB — DRUG SCREEN, NO CONFIRMATION, URINE
AMPHETAMINES URINE: NEGATIVE
BARBITURATES URINE: NEGATIVE
BENZODIAZEPINES URINE: NEGATIVE
BUPRENORPHINE URINE: NEGATIVE
CANNABINOIDS URINE: NEGATIVE
COCAINE METABOLITES URINE: NEGATIVE
FENTANYL, URINE: NEGATIVE
METHADONE URINE: NEGATIVE
OPIATES URINE: POSITIVE — AB
OXYCODONE URINE: NEGATIVE
PCP URINE: NEGATIVE

## 2024-02-05 LAB — LIPID PANEL
CHOL/HDL RATIO: 3.6
CHOLESTEROL: 132 mg/dL (ref <200)
HDL CHOL: 37 mg/dL — ABNORMAL LOW (ref >=40)
LDL CALC: 81 mg/dL (ref 0–100)
TRIGLYCERIDES: 69 mg/dL (ref <=150)
VLDL CALC: 14 mg/dL (ref 0–50)

## 2024-02-05 LAB — COMPREHENSIVE METABOLIC PNL, FASTING
ALBUMIN/GLOBULIN RATIO: 1.3 (ref 0.8–1.4)
ALBUMIN: 4.1 g/dL (ref 3.5–5.7)
ALKALINE PHOSPHATASE: 46 U/L (ref 34–104)
ALT (SGPT): 11 U/L (ref 7–52)
ANION GAP: 6 mmol/L (ref 4–13)
AST (SGOT): 18 U/L (ref 13–39)
BILIRUBIN TOTAL: 0.4 mg/dL (ref 0.3–1.0)
BUN/CREA RATIO: 12 (ref 6–22)
BUN: 11 mg/dL (ref 7–25)
CALCIUM, CORRECTED: 8.9 mg/dL (ref 8.9–10.8)
CALCIUM: 9 mg/dL (ref 8.6–10.3)
CHLORIDE: 106 mmol/L (ref 98–107)
CO2 TOTAL: 26 mmol/L (ref 21–31)
CREATININE: 0.9 mg/dL (ref 0.60–1.30)
ESTIMATED GFR: 92 mL/min/1.73mˆ2 (ref >59)
GLOBULIN: 3.1 (ref 2.0–3.5)
GLUCOSE: 100 mg/dL (ref 74–109)
OSMOLALITY, CALCULATED: 275 mosm/kg (ref 270–290)
POTASSIUM: 3.9 mmol/L (ref 3.5–5.1)
PROTEIN TOTAL: 7.2 g/dL (ref 6.4–8.9)
SODIUM: 138 mmol/L (ref 136–145)

## 2024-02-05 LAB — URINALYSIS, MACROSCOPIC
BILIRUBIN: NEGATIVE mg/dL
BLOOD: NEGATIVE mg/dL
GLUCOSE: NEGATIVE mg/dL
KETONES: NEGATIVE mg/dL
LEUKOCYTES: NEGATIVE WBCs/uL
NITRITE: NEGATIVE
PH: 6 (ref 5.0–9.0)
PROTEIN: NEGATIVE mg/dL
SPECIFIC GRAVITY: 1.016 (ref 1.002–1.030)
UROBILINOGEN: NORMAL mg/dL

## 2024-02-05 LAB — MICROALBUMIN/CREATININE RATIO, URINE, RANDOM
CREATININE RANDOM URINE: 133 mg/dL — ABNORMAL HIGH (ref 30–125)
MICROALBUMIN RANDOM URINE: 0.7 mg/dL
MICROALBUMIN/CREATININE RATIO RANDOM URINE: 5.3 mg/g

## 2024-02-05 LAB — THYROID STIMULATING HORMONE WITH FREE T4 REFLEX: TSH: 0.712 u[IU]/mL (ref 0.450–5.330)

## 2024-02-05 LAB — IRON TRANSFERRIN AND TIBC
IRON (TRANSFERRIN) SATURATION: 16 % — ABNORMAL LOW (ref 20–50)
IRON: 50 ug/dL (ref 50–212)
TOTAL IRON BINDING CAPACITY: 309 ug/dL (ref 250–450)
TRANSFERRIN: 221 mg/dL (ref 203–362)
UIBC: 259 ug/dL (ref 130–375)

## 2024-02-05 LAB — VITAMIN D 25 TOTAL: VITAMIN D 25, TOTAL: 35.06 ng/mL (ref 30.00–100.00)

## 2024-02-05 LAB — VITAMIN B12: VITAMIN B 12: 261 pg/mL (ref 180–914)

## 2024-02-05 LAB — PSA, DIAGNOSTIC: PSA: 0.45 ng/mL (ref <=4.00)

## 2024-02-05 LAB — URIC ACID: URIC ACID: 6.2 mg/dL (ref 2.3–7.6)

## 2024-02-05 LAB — URINALYSIS, MICROSCOPIC
BACTERIA: NEGATIVE /HPF
RBCS: <1 /HPF (ref <4)
WBCS: 1 /HPF (ref <6)

## 2024-02-05 LAB — HGA1C (HEMOGLOBIN A1C WITH EST AVG GLUCOSE): HEMOGLOBIN A1C: 6 % (ref 4.0–6.0)

## 2024-02-05 LAB — FOLATE: FOLATE: 15.4 ng/mL (ref 5.9–24.8)

## 2024-02-05 MED ORDER — LISINOPRIL 10 MG TABLET: 10.0000 mg | ORAL_TABLET | Freq: Every day | ORAL | 1 refills | Status: DC

## 2024-02-05 MED ORDER — TRIAMCINOLONE ACETONIDE 0.1 % TOPICAL CREAM: TOPICAL_CREAM | Freq: Two times a day (BID) | CUTANEOUS | 1 refills | Status: AC

## 2024-02-05 MED ORDER — FLUOXETINE 10 MG CAPSULE: 10.0000 mg | ORAL_CAPSULE | Freq: Every day | ORAL | 1 refills | Status: DC

## 2024-02-05 MED ORDER — HYDROCODONE 7.5 MG-ACETAMINOPHEN 325 MG TABLET: 1.0000 | ORAL_TABLET | Freq: Three times a day (TID) | ORAL | 0 refills | Status: DC | PRN

## 2024-02-05 NOTE — Progress Notes (Signed)
 INTERNAL MEDICINE[facility]ING A  [address]  [place] [address]          Follow Up      Reason for Visit: Follow Up (1 month follow up )     This note was created with assistance from [employer]bridge via capture of conversational audio.  Consent was obtained from the patient prior to recording.      History of Present Illness  Gregory Case is a 68 year old male who presents for his normal f/u and med refills however reports episode of vasovagal syncope.    He experienced a vasovagal syncope episode at approximately 4:30 AM while in the bathroom, during which he lost consciousness and hit the edge of the door facing, resulting in a cut. This was his first experience of such an episode, and he did not seek emergency care at the time. No chest pain, pressure, shortness of breath, dizziness, or heart palpitations were noted during or after the episode. He has not experienced any similar episodes since.    His BP is stable at 122/80 and he continues prinivil  10 mg daily.    His medication history includes a switch from Prozac  to Cymbalta , which he has since weaned off due to [place]. He is not currently taking prozac  again but would like to restart. He also takes clonazepam  at night.    He reports a rash on his knee that itches and causes dry skin. He has not been using any specific cream for it, only general skin lotion.    He's had some borderline high BS readings however A1C has been stable.  Also continues Zocor  for his chol levels and needs f/u labs today.     Follow-up chronic back pain/DJD/OA for which patient has been on long-term narcotic use for multiple years tolerated ;hx of TKR right knee noted with need for revision due to increased pain also noted.  He also has chronic arthritic pains in the hands knees and sometimes between dosing of Norco he uses motrin. He also has hx of gout however no red hot inflammed joints noted.  Request medication refills.    F/U chronic anxiety for which he uses  clonazepam  1mg  prn in the evenings for anxiety as well as to help him rest.  Pt states he is doing well at this time and denies any panic attacks or increased anxiety/depression sx. Pt is aware of increased risk of medication use w narcotics and denies any previous SE/sedation.      PHQ Questionnaire         Past Medical History:   Diagnosis Date    Abnormal glucose level     Anxiety     Chronic back pain     COVID-19 vaccine series completed     Depression     Mixed hyperlipidemia     Osteoarthritis          Past Surgical History:   Procedure Laterality Date    COLONOSCOPY W/ POLYPECTOMY      HX BACK SURGERY      HX CARPAL TUNNEL RELEASE      HX KNEE REPLACMENT Right     HX TONSILLECTOMY        Family Medical History:       Problem Relation (Age of Onset)    Coronary Artery Disease Mother    Diabetes type II Mother    Hypertension (High Blood Pressure) Other            Social  History     Tobacco Use    Smoking status: Never     Passive exposure: Never    Smokeless tobacco: Never   Vaping Use    Vaping status: Never Used   Substance Use Topics    Alcohol use: Yes     Comment: few times a week    Drug use: Never     Medication:  clonazePAM  (KLONOPIN ) 1 mg Oral Tablet, Take 1 Tablet (1 mg total) by mouth Every night  simvastatin  (ZOCOR ) 40 mg Oral Tablet, Take 1 Tablet (40 mg total) by mouth Every night  colchicine  0.6 mg Oral Tablet, Take 1 Tablet (0.6 mg total) by mouth Daily  DULoxetine  (CYMBALTA  DR) 60 mg Oral Capsule, Delayed Release(E.C.), Take 1 Capsule (60 mg total) by mouth Daily  HYDROcodone -acetaminophen  (NORCO) 7.5-325 mg Oral Tablet, Take 1 Tablet by mouth Every 8 hours as needed for Pain for up to 30 days  lisinopriL  (PRINIVIL ) 10 mg Oral Tablet, Take 1 Tablet (10 mg total) by mouth Daily    No facility-administered medications prior to visit.    Allergies:  Allergies   Allergen Reactions    Meloxicam Hives/ Urticaria       Physical Exam:  Vitals:    02/05/24 0827   BP: 122/80   Pulse: 90   Temp: 36.3 C  (97.3 F)   SpO2: 94%   Weight: 81.2 kg (179 lb)   Height: 1.651 m (5' 5)   BMI: 29.79       Vitals reviewed  General appearance:  Patient is sleepy today however alert cooperative, in no acute distress  Lungs: clear to auscultation bilaterally; no wheezes or rhonchi   Heart: regular rate and rhythm; normal S1 & S2; no murmur  Abdomen: soft, non-tender, not distended; bowel sounds present  Extremities: extremities normal ROM, +arthritic changes bilat hands/knees w hx of previous RT TKR - scar noted  +straight leg raise left leg at 85 degrees  Gait stable  no cyanosis or edema , no rash  +purpura bilat upper extrem  Psych: alert and oriented x 3  Neuro: CN 2-12 grossly intact      Assessment & Plan  Vasovagal episode  Syncope episode  Experienced vasovagal syncope with fall and minor injury. History of prior vasovagal syncope. Differential includes cardiac causes, but no immediate cardiac issues noted.  - Ordered EKG.  - Ordered blood work for electrolytes.  - ordered cardiologist referral.  - ordered CT calcium scan of heart.  Abnormal EKG  Experienced vasovagal syncope with fall and minor injury. History of prior vasovagal syncope. Differential includes cardiac causes, but no immediate cardiac issues noted.  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  - Ordered blood work for electrolytes.  - ordered cardiologist referral.  - ordered CT calcium scan of heart.  SOB (shortness of breath) on exertion  Experienced vasovagal syncope with fall and minor injury. History of prior vasovagal syncope. Differential includes cardiac causes, but no immediate cardiac issues noted.  - Ordered EKG.  - Ordered blood work for electrolytes.  - ordered cardiologist referral.  - ordered CT calcium scan of heart.  Essential hypertension  Hypertension controlled with use of Prinivil  10 mg daily.  -continue prinivil  10 mg qd  -Continue low na diet/wt management  -continue BP monitoring  Eczema, unspecified type  Dermatitis Rash on knee consistent with  eczema.  - Prescribed hydrocortisone cream twice daily.  Prediabetes  Hx of pre diabetes w last A1C 5.6.  -Labs obtained in office  in f/u  Mixed hyperlipidemia  Hx of elevated chol w statin therapy.  -continue Zocor  40 mg q hs  -continue diet wt management  -Labs obtained today to f/u on chol levels  Chronic low back pain, unspecified back pain laterality, unspecified whether sciatica present  Chronic lower back pain with a history of DJD as well as previous injections noted.  Continues following with neurosurgeon PRN.  Continues chronic pain management with the use of Norco.  -Continue Norco 7.5mg  q 8 hrs- refills given  Osteoarthritis of multiple joints, unspecified osteoarthritis type  Chronic lower back pain with a history of DJD as well as previous injections noted.  Continues following with neurosurgeon PRN.  Continues chronic pain management with the use of Norco.  -Continue Norco 7.5mg  q 8 hrs- refills given  Gout, unspecified cause, unspecified chronicity, unspecified site  Hx of gout w chronic joint pain. No current red hot inflammed joints noted.  -continue colchicine  prn  -continue low uric acid diet  Anxiety  Anxiety w hx of depression.  Currently on duloxetine  w SE noted so requesting to switch back to Prozac  previously tolerated.  - DC Cymbalta   -restart Prozac  10 mg qd    Vitamin D  deficiency  Hx of vit d def.  -vit d level obtained in blood work  Benign prostatic hyperplasia, unspecified whether lower urinary tract symptoms present  BPH w need for PSA screening. No current urinary sx.  -PSA obtained today  Long-term use of high-risk medication  Long term use of controlled meds requiring urine drug screen.  -Urine drug screen obtained  Abnormal finding of blood chemistry, unspecified      Orders Placed This Encounter    CT HEART CALCIUM SCORE    CBC/DIFF    COMPREHENSIVE METABOLIC PNL, FASTING    HGA1C (HEMOGLOBIN A1C WITH EST AVG GLUCOSE)    URINALYSIS, MACROSCOPIC AND MICROSCOPIC W/CULTURE REFLEX     LIPID PANEL    MICROALBUMIN/CREATININE RATIO, URINE, RANDOM    THYROID  STIMULATING HORMONE WITH FREE T4 REFLEX    URIC ACID    DRUG SCREEN, NO CONFIRMATION, URINE    PSA, DIAGNOSTIC    VITAMIN D  25 TOTAL    VITAMIN B12    IRON TRANSFERRIN AND TIBC    FOLATE    CBC WITH DIFF    URINALYSIS, MACROSCOPIC    URINALYSIS, MICROSCOPIC    Referral to CARDIOLOGY - [place], [place] - [facility]    [address] - ECG In Clinic ([facility] [employer])    FLUoxetine  (PROZAC ) 10 mg Oral Capsule    triamcinolone  acetonide 0.1 % Cream    lisinopriL  (PRINIVIL ) 10 mg Oral Tablet       Post-Discharge Follow Up Appointments       Tuesday Mar 25, 2024    PRN  CT with PRN CT 2 at  2:30 PM      Wednesday Apr 09, 2024    Return Patient Visit with Scharlene No, PA-C at  8:15 AM      Wednesday Apr 16, 2024    New Patient Visit with Sherre Senior, DO at 11:25 AM      Tuesday Apr 22, 2024    New Patient Visit with Neomi Senior, MD at  2:30 PM      Monday Apr 28, 2024    New Patient Visit with Shirlee Omega NOVAK, DO at  2:00 PM      Wednesday May 07, 2024    Return Patient Visit with Scharlene, Vanessa Alesi, PA-C at  8:15  AM      Monday Jun 02, 2024    Return Telephone Visit with Scharlene No, PA-C at  2:00 PM      Thursday Jun 05, 2024    Return Patient Visit with Scharlene No, PA-C at  8:15 AM      Cardiology [facility], [facility], [place]  [address]  [place] [address]  [phone] Imaging Services, [facility], [place]   [address]  [place] [address]  [phone] Internal Medicine, Building A  [facility]  [address]  [place] [address]  [phone]    Neurology, [facility]  [address] [place]  [address]  [place] [address]  [phone] Pain Management, [address]  [place], [place]  [address]  [place] [address]  [phone]                Seek medical attention for new or worsening symptoms.  Patient has been seen in this clinic within the last 3 years.     Akashdeep Chuba, PA-C          This note was partially created using MModal Fluency Direct system (voice recognition software) and is inherently subject to errors including those of syntax and sound-alike substitutions which may escape proofreading.  In such instances, original meaning may be extrapolated by contextual derivation.

## 2024-02-05 NOTE — Nursing Note (Signed)
 1 month follow up

## 2024-02-06 ENCOUNTER — Telehealth (INDEPENDENT_AMBULATORY_CARE_PROVIDER_SITE_OTHER): Payer: Self-pay | Admitting: Physician Assistant

## 2024-02-06 ENCOUNTER — Ambulatory Visit (INDEPENDENT_AMBULATORY_CARE_PROVIDER_SITE_OTHER): Payer: Self-pay

## 2024-02-06 NOTE — Telephone Encounter (Signed)
 Attempted to contact patient's wife; no answer. Left voicemail.

## 2024-02-06 NOTE — Telephone Encounter (Signed)
 Patient's wife called. She stated that he was referred to a cardiologist. She wanted to make you aware that the reason he had passed out in the bathroom was due to his IBS.

## 2024-02-07 NOTE — Telephone Encounter (Signed)
 Patient's wife notified.

## 2024-02-20 ENCOUNTER — Ambulatory Visit (INDEPENDENT_AMBULATORY_CARE_PROVIDER_SITE_OTHER): Payer: Self-pay | Admitting: PHYSICIAN ASSISTANT

## 2024-02-22 ENCOUNTER — Other Ambulatory Visit (INDEPENDENT_AMBULATORY_CARE_PROVIDER_SITE_OTHER): Payer: Self-pay | Admitting: Physician Assistant

## 2024-02-22 MED ORDER — COLCHICINE 0.6 MG TABLET: 0.6000 mg | ORAL_TABLET | Freq: Every day | ORAL | 1 refills | Status: AC

## 2024-03-05 ENCOUNTER — Ambulatory Visit (INDEPENDENT_AMBULATORY_CARE_PROVIDER_SITE_OTHER): Payer: Self-pay | Admitting: Physician Assistant

## 2024-03-10 ENCOUNTER — Ambulatory Visit (INDEPENDENT_AMBULATORY_CARE_PROVIDER_SITE_OTHER): Admitting: NURSE PRACTITIONER

## 2024-03-10 ENCOUNTER — Other Ambulatory Visit (INDEPENDENT_AMBULATORY_CARE_PROVIDER_SITE_OTHER): Payer: Self-pay | Admitting: Physician Assistant

## 2024-03-10 ENCOUNTER — Encounter (INDEPENDENT_AMBULATORY_CARE_PROVIDER_SITE_OTHER): Payer: Self-pay | Admitting: Physician Assistant

## 2024-03-10 ENCOUNTER — Other Ambulatory Visit: Payer: Self-pay

## 2024-03-10 ENCOUNTER — Ambulatory Visit: Attending: Physician Assistant | Admitting: Physician Assistant

## 2024-03-10 VITALS — BP 132/82 | HR 81 | Ht 65.0 in | Wt 181.0 lb

## 2024-03-10 DIAGNOSIS — I10 Essential (primary) hypertension: Secondary | ICD-10-CM | POA: Insufficient documentation

## 2024-03-10 DIAGNOSIS — R413 Other amnesia: Secondary | ICD-10-CM | POA: Insufficient documentation

## 2024-03-10 DIAGNOSIS — E538 Deficiency of other specified B group vitamins: Secondary | ICD-10-CM | POA: Insufficient documentation

## 2024-03-10 DIAGNOSIS — M545 Low back pain, unspecified: Secondary | ICD-10-CM | POA: Insufficient documentation

## 2024-03-10 DIAGNOSIS — F39 Unspecified mood [affective] disorder: Secondary | ICD-10-CM | POA: Insufficient documentation

## 2024-03-10 DIAGNOSIS — G8929 Other chronic pain: Secondary | ICD-10-CM | POA: Insufficient documentation

## 2024-03-10 MED ORDER — CYANOCOBALAMIN (VIT B-12) 1,000 MCG/ML INJECTION SOLUTION
1000.0000 ug | INTRAMUSCULAR | Status: AC
  Administered 2024-03-10: 1000 ug via SUBCUTANEOUS

## 2024-03-10 NOTE — Nursing Note (Signed)
Patient presents today for medication refills.

## 2024-03-10 NOTE — Assessment & Plan Note (Signed)
 Chronic anxiety/depression issues w pt currently back on Prozac  10 mg daily and overall doing well. Continues klonopin  q hs prn also.  -continue Prozac  10 mg daily and klonopin  1mg  q hs prn.

## 2024-03-10 NOTE — Assessment & Plan Note (Signed)
 Chronic lower back pain with a history of recent injections noted.  Continues following with neurosurgeon PRN.  Continues chronic pain management with the use of Norco.  -Continue Norco 7.5mg  q 8 hrs- refills given

## 2024-03-10 NOTE — Nursing Note (Signed)
 03/10/24 0900   Date & Place of Birth   Month 1   Gregory Case 1   Year 1   Town 1   State/Nation 1   Registration   Number of presentations 3   Mental Reversal   5 to 1 2   DLROW 3   First Call   Spontaneous Recall A 3   Spontaneous Recall B 3   Spontaneous Recall C 2   Temporal Orientation   Year  8   Season 1   Month 2   Gregory Case of the Month 3   Gregory Case of Week 1   Spatial Orientation   State 2   County 1   City/Town 1   Hospital Office Building/Home 1   Naming   Pencil 1   Forehead 1   Chin 1   Shoulder 1   Watch 1   Elbow 1   Knuckle 1   4-Legged Animal   30 Seconds 7   Similarities   Arm-Leg 2   Laughing - Crying 2   Eating - Sleeping 2   Repetition   Repetition 2   No IFs 1   Ands 1   Butts 1   Read & Obey   Read & Obey  3   Writing   Write the sentence I.. would like to go home/out 5   Copy 2 Pentagons   5 Equal sides 4   5 Unequal sides 3   Other enclosed figure 2   2 or more lines 1   Intersection 2   Three Stage Comma ID   Take this paper with your left/right hand 1   Fold it in half and 1   Hand it back to me 1   Second Recall   Something to wear 3   Color 2   Good personal quality 3   Total Score   Total Score 97

## 2024-03-10 NOTE — Assessment & Plan Note (Signed)
 Hypertension controlled with use of Prinivil  10 mg daily.  -continue prinivil  10 mg qd  -Continue low na diet/wt management  -continue BP monitoring

## 2024-03-10 NOTE — Nursing Note (Signed)
 Patient presents today for routine 1 month follow up for medication refills.

## 2024-03-10 NOTE — Progress Notes (Signed)
 [facility]  [address]  [place] [address]          Follow Up      Reason for Visit: Follow Up (Routine 1 month )     This note was created with assistance from [employer] via capture of conversational audio.  Consent was obtained from the patient prior to recording.      History of Present Illness  Gregory Case is a 69 year old male who presents with memory concerns and a history of passing out.    He has been experiencing intermittent memory loss, which he describes as 'part time memory loss.' He has not undergone any head scans previously and is unsure if dementia runs in his family, although his siblings are in their 27s and 90s. Mentions Wife wanting him to start medicine for his memory.    He recalls an incident where he passed out at 4:30 AM while reaching for a towel, resulting in a fall and hitting his head, leaving a scar. No headaches, dizziness, or additional passing out episodes have occurred since then.      His B12 level was previously low, with a recent level of 261, an improvement from a prior level of 198. He is unsure if he received a B12 shot during his last visit. He has not yet seen a cardiologist but has an upcoming appointment on February 17th and a CT calcium scan scheduled for January 20th.    No headaches, dizziness, or additional passing out episodes.     Follow-up blood pressure/hypertension with reading today 132/82.  Pt states he feels well and denies any headaches dizziness chest pain. Continues to take prinivil  10 mg po daily.    F/U chronic anxiety/depresion w pt is now back to taking Prozac  10 mg daily instead of the Cymbalta  due to SE from the Cymbalta . He has also on clonazepam  1mg  prn in the evenings for anxiety as well as to help him rest.  Pt states he is doing well at this time and denies any panic attacks or increased anxiety/depression sx.     Follow-up chronic back pain/DJD/OA for which patient has been on long-term narcotic use for  multiple years tolerated ;hx of TKR right knee noted with need for revision due to increased pain also noted.  He also has chronic arthritic pains in the hands knees and sometimes between dosing of Norco he uses motrin. He also has hx of gout however no red hot inflammed joints noted.  Request medication refills.      PHQ Questionnaire         Past Medical History:   Diagnosis Date    Abnormal glucose level     Anxiety     Chronic back pain     COVID-19 vaccine series completed     Depression     Mixed hyperlipidemia     Osteoarthritis          Past Surgical History:   Procedure Laterality Date    COLONOSCOPY W/ POLYPECTOMY      HX BACK SURGERY      HX CARPAL TUNNEL RELEASE      HX KNEE REPLACMENT Right     HX TONSILLECTOMY        Family Medical History:       Problem Relation (Age of Onset)    Coronary Artery Disease Mother    Diabetes type II Mother    Hypertension (High Blood Pressure) Other  Social History     Tobacco Use    Smoking status: Never     Passive exposure: Never    Smokeless tobacco: Never   Vaping Use    Vaping status: Never Used   Substance Use Topics    Alcohol use: Yes     Comment: few times a week    Drug use: Never     Medication:  clonazePAM  (KLONOPIN ) 1 mg Oral Tablet, Take 1 Tablet (1 mg total) by mouth Every night  colchicine  0.6 mg Oral Tablet, Take 1 Tablet (0.6 mg total) by mouth Daily  FLUoxetine  (PROZAC ) 10 mg Oral Capsule, Take 1 Capsule (10 mg total) by mouth Daily  lisinopriL  (PRINIVIL ) 10 mg Oral Tablet, Take 1 Tablet (10 mg total) by mouth Daily  simvastatin  (ZOCOR ) 40 mg Oral Tablet, Take 1 Tablet (40 mg total) by mouth Every night    No facility-administered medications prior to visit.    Allergies:  Allergies   Allergen Reactions    Meloxicam Hives/ Urticaria       Physical Exam:  Vitals:    03/10/24 0817   BP: 132/82   Pulse: 81   SpO2: 96%   Weight: 82.1 kg (181 lb)   Height: 1.651 m (5' 5)   BMI: 30.12     Vitals reviewed  General appearance:  Patient is sleepy  today however alert cooperative, in no acute distress  Lungs: clear to auscultation bilaterally; no wheezes or rhonchi   Heart: regular rate and rhythm; normal S1 & S2; no murmur  Abdomen: soft, non-tender, not distended; bowel sounds present  Extremities: extremities normal ROM, +arthritic changes bilat hands/knees w hx of previous RT TKR - scar noted  +straight leg raise left leg at 85 degrees  Gait stable  no cyanosis or edema , no rash  +purpura bilat upper extrem  Psych: alert and oriented x place person  Neuro: CN 2-12 grossly intact  Assessment & Plan  Memory loss  Memory loss possibly due to low vitamin B12 deficiency or age-related cognitive decline. Differential includes age-related cognitive decline versus early dementia.  -MMSE obtained in office with score of 97.  -ordered CT scan of head  - Administered B12 injection to assess for improvement in cognitive function.  - Encouraged cognitive activities such as puzzles, reading, and games.  Low serum vitamin B12  Vitamin B12 level recently borderline low at 261, improved from 198. Discussed SE of lower levels including fatigue and memory issues.  - Administered B12 injection to improve energy levels and cognitive function.  - Monitor response to B12 injection for improvement in symptoms.  Essential hypertension  Hypertension controlled with use of Prinivil  10 mg daily.  -continue prinivil  10 mg qd  -Continue low na diet/wt management  -continue BP monitoring  Mood disorder (CMS HCC)  Chronic anxiety/depression issues w pt currently back on Prozac  10 mg daily and overall doing well. Continues klonopin  q hs prn also.  -continue Prozac  10 mg daily and klonopin  1mg  q hs prn.  Chronic low back pain, unspecified back pain laterality, unspecified whether sciatica present  Chronic lower back pain with a history of recent injections noted.  Continues following with neurosurgeon PRN.  Continues chronic pain management with the use of Norco.  -Continue Norco 7.5mg  q  8 hrs- refills given     Orders Placed This Encounter    CT BRAIN WO IV CONTRAST    Referral to NEUROLOGY - [facility]    cyanocobalamin  (VITAMIN B12)  1000 mcg/mL injection       Post-Discharge Follow Up Appointments       Tuesday Mar 25, 2024    PRN  CT with PRN CT 2 at  2:30 PM      Wednesday Apr 09, 2024    Return Patient Visit with Scharlene No, PA-C at  8:15 AM      Tuesday Apr 22, 2024    New Patient Visit with Neomi Senior, MD at  2:30 PM      Wednesday May 07, 2024    Return Patient Visit with Scharlene No, PA-C at  8:15 AM      Monday Jun 02, 2024    Return Telephone Visit with Scharlene No, PA-C at  2:00 PM      Thursday Jun 05, 2024    Return Patient Visit with Scharlene No, PA-C at  8:15 AM      Cardiology [facility], [facility], [place]  [address]  [place] [address]  [phone] Imaging Services, [facility] [place], [place]   [address]  [place] [address]  [phone] [facility] Medicine, [facility]  [facility]  [address]  [place] [address]  [phone]             Seek medical attention for new or worsening symptoms.  Patient has been seen in this clinic within the last 3 years.     Landynn Dupler, PA-C          This note was partially created using MModal Fluency Direct system (voice recognition software) and is inherently subject to errors including those of syntax and sound-alike substitutions which may escape proofreading.  In such instances, original meaning may be extrapolated by contextual derivation.

## 2024-03-11 ENCOUNTER — Encounter (INDEPENDENT_AMBULATORY_CARE_PROVIDER_SITE_OTHER): Payer: Self-pay

## 2024-03-11 ENCOUNTER — Other Ambulatory Visit (INDEPENDENT_AMBULATORY_CARE_PROVIDER_SITE_OTHER): Payer: Self-pay | Admitting: Physician Assistant

## 2024-03-11 DIAGNOSIS — M47816 Spondylosis without myelopathy or radiculopathy, lumbar region: Secondary | ICD-10-CM | POA: Insufficient documentation

## 2024-03-11 DIAGNOSIS — M549 Dorsalgia, unspecified: Secondary | ICD-10-CM

## 2024-03-11 DIAGNOSIS — G8929 Other chronic pain: Secondary | ICD-10-CM

## 2024-03-11 DIAGNOSIS — M25561 Pain in right knee: Secondary | ICD-10-CM | POA: Insufficient documentation

## 2024-03-11 MED ORDER — HYDROCODONE 7.5 MG-ACETAMINOPHEN 325 MG TABLET: 1.0000 | ORAL_TABLET | Freq: Three times a day (TID) | ORAL | 0 refills | Status: DC | PRN

## 2024-03-11 NOTE — Telephone Encounter (Signed)
 Pharmacy is needing new prescription with diagnosis codes. Codes added.

## 2024-03-21 ENCOUNTER — Ambulatory Visit (HOSPITAL_BASED_OUTPATIENT_CLINIC_OR_DEPARTMENT_OTHER)

## 2024-03-23 ENCOUNTER — Encounter (INDEPENDENT_AMBULATORY_CARE_PROVIDER_SITE_OTHER): Payer: Self-pay | Admitting: Physician Assistant

## 2024-03-23 NOTE — Assessment & Plan Note (Signed)
 Hx of pre diabetes w last A1C 5.6.  -Labs obtained in office in f/u

## 2024-03-23 NOTE — Assessment & Plan Note (Signed)
 BPH w need for PSA screening. No current urinary sx.  -PSA obtained today

## 2024-03-23 NOTE — Assessment & Plan Note (Signed)
 Hx of gout w chronic joint pain. No current red hot inflammed joints noted.  -continue colchicine  prn  -continue low uric acid diet

## 2024-03-23 NOTE — Assessment & Plan Note (Signed)
 Chronic lower back pain with a history of DJD as well as previous injections noted.  Continues following with neurosurgeon PRN.  Continues chronic pain management with the use of Norco.  -Continue Norco 7.5mg  q 8 hrs- refills given

## 2024-03-23 NOTE — Assessment & Plan Note (Signed)
 Anxiety w hx of depression.  Currently on duloxetine  w SE noted so requesting to switch back to Prozac  previously tolerated.  - DC Cymbalta   -restart Prozac  10 mg qd

## 2024-03-23 NOTE — Assessment & Plan Note (Signed)
 Hx of vit d def.  -vit d level obtained in blood work

## 2024-03-23 NOTE — Assessment & Plan Note (Signed)
 Hx of elevated chol w statin therapy.  -continue Zocor  40 mg q hs  -continue diet wt management  -Labs obtained today to f/u on chol levels

## 2024-03-23 NOTE — Assessment & Plan Note (Signed)
 Hypertension controlled with use of Prinivil  10 mg daily.  -continue prinivil  10 mg qd  -Continue low na diet/wt management  -continue BP monitoring

## 2024-03-25 ENCOUNTER — Ambulatory Visit
Admission: RE | Admit: 2024-03-25 | Discharge: 2024-03-25 | Disposition: A | Discharge status: Home / Self Care | Source: Ambulatory Visit | Attending: Physician Assistant | Admitting: Physician Assistant

## 2024-03-25 ENCOUNTER — Other Ambulatory Visit: Payer: Self-pay

## 2024-03-25 DIAGNOSIS — R7303 Prediabetes: Secondary | ICD-10-CM | POA: Insufficient documentation

## 2024-03-25 DIAGNOSIS — I10 Essential (primary) hypertension: Secondary | ICD-10-CM | POA: Insufficient documentation

## 2024-03-25 DIAGNOSIS — R9431 Abnormal electrocardiogram [ECG] [EKG]: Secondary | ICD-10-CM | POA: Insufficient documentation

## 2024-03-25 DIAGNOSIS — E782 Mixed hyperlipidemia: Secondary | ICD-10-CM | POA: Insufficient documentation

## 2024-03-25 DIAGNOSIS — R55 Syncope and collapse: Secondary | ICD-10-CM | POA: Insufficient documentation

## 2024-03-25 DIAGNOSIS — R0602 Shortness of breath: Secondary | ICD-10-CM | POA: Insufficient documentation

## 2024-03-27 DIAGNOSIS — R9431 Abnormal electrocardiogram [ECG] [EKG]: Secondary | ICD-10-CM

## 2024-04-02 ENCOUNTER — Ambulatory Visit (INDEPENDENT_AMBULATORY_CARE_PROVIDER_SITE_OTHER): Payer: Self-pay | Admitting: Physician Assistant

## 2024-04-09 ENCOUNTER — Other Ambulatory Visit (INDEPENDENT_AMBULATORY_CARE_PROVIDER_SITE_OTHER): Payer: Self-pay | Admitting: Physician Assistant

## 2024-04-09 ENCOUNTER — Encounter (INDEPENDENT_AMBULATORY_CARE_PROVIDER_SITE_OTHER): Payer: Self-pay | Admitting: Physician Assistant

## 2024-04-09 ENCOUNTER — Other Ambulatory Visit: Payer: Self-pay

## 2024-04-09 ENCOUNTER — Ambulatory Visit: Admitting: Physician Assistant

## 2024-04-09 VITALS — BP 138/82 | HR 80 | Ht 65.0 in | Wt 184.0 lb

## 2024-04-09 DIAGNOSIS — M7989 Other specified soft tissue disorders: Secondary | ICD-10-CM

## 2024-04-09 DIAGNOSIS — M25561 Pain in right knee: Secondary | ICD-10-CM

## 2024-04-09 DIAGNOSIS — G8928 Other chronic postprocedural pain: Secondary | ICD-10-CM

## 2024-04-09 DIAGNOSIS — G8929 Other chronic pain: Secondary | ICD-10-CM

## 2024-04-09 DIAGNOSIS — F39 Unspecified mood [affective] disorder: Secondary | ICD-10-CM

## 2024-04-09 DIAGNOSIS — M549 Dorsalgia, unspecified: Secondary | ICD-10-CM

## 2024-04-09 DIAGNOSIS — M545 Low back pain, unspecified: Secondary | ICD-10-CM

## 2024-04-09 DIAGNOSIS — I10 Essential (primary) hypertension: Secondary | ICD-10-CM

## 2024-04-09 DIAGNOSIS — M47816 Spondylosis without myelopathy or radiculopathy, lumbar region: Secondary | ICD-10-CM

## 2024-04-09 DIAGNOSIS — Z96651 Presence of right artificial knee joint: Secondary | ICD-10-CM

## 2024-04-09 MED ORDER — SIMVASTATIN 40 MG TABLET: 40.0000 mg | ORAL_TABLET | Freq: Every evening | ORAL | 1 refills | Status: AC

## 2024-04-09 MED ORDER — LISINOPRIL 10 MG TABLET: 10.0000 mg | ORAL_TABLET | Freq: Every day | ORAL | 1 refills | Status: AC

## 2024-04-09 MED ORDER — HYDROCODONE 7.5 MG-ACETAMINOPHEN 325 MG TABLET: 1.0000 | ORAL_TABLET | Freq: Three times a day (TID) | ORAL | 0 refills | Status: AC | PRN

## 2024-04-09 MED ORDER — CLONAZEPAM 1 MG TABLET: 1.0000 mg | ORAL_TABLET | Freq: Every evening | ORAL | 2 refills | Status: AC

## 2024-04-09 MED ORDER — FLUOXETINE 10 MG CAPSULE: 10.0000 mg | ORAL_CAPSULE | Freq: Every day | ORAL | 1 refills | Status: AC

## 2024-04-09 NOTE — Telephone Encounter (Signed)
 Patient presents today for routine 1 month follow up for medication refill.

## 2024-04-09 NOTE — Assessment & Plan Note (Signed)
 Chronic anxiety/depression issues w pt currently back on Prozac  10 mg daily and overall doing well. Continues klonopin  q hs prn also.  -continue Prozac  10 mg daily and klonopin  1mg  q hs prn.

## 2024-04-09 NOTE — Nursing Note (Signed)
 Patient presents today as a routine 1 month follow up for medication refills.

## 2024-04-09 NOTE — Assessment & Plan Note (Signed)
 Chronic lower back pain with a history of DJD as well as previous injections noted.  Continues following with neurosurgeon PRN.  Continues chronic pain management with the use of Norco.  -Continue Norco 7.5mg  q 8 hrs- refills given

## 2024-04-09 NOTE — Assessment & Plan Note (Signed)
 Hypertension controlled with use of Prinivil  10 mg daily.  -continue prinivil  10 mg qd  -Continue low na diet/wt management  -continue BP monitoring

## 2024-04-16 ENCOUNTER — Ambulatory Visit (INDEPENDENT_AMBULATORY_CARE_PROVIDER_SITE_OTHER): Payer: Self-pay | Admitting: NEUROLOGY

## 2024-04-22 ENCOUNTER — Ambulatory Visit (INDEPENDENT_AMBULATORY_CARE_PROVIDER_SITE_OTHER): Admitting: INTERVENTIONAL CARDIOLOGY

## 2024-04-28 ENCOUNTER — Ambulatory Visit (INDEPENDENT_AMBULATORY_CARE_PROVIDER_SITE_OTHER): Payer: Self-pay

## 2024-04-29 ENCOUNTER — Ambulatory Visit (INDEPENDENT_AMBULATORY_CARE_PROVIDER_SITE_OTHER)

## 2024-05-02 ENCOUNTER — Ambulatory Visit (INDEPENDENT_AMBULATORY_CARE_PROVIDER_SITE_OTHER): Payer: Self-pay | Admitting: Physician Assistant

## 2024-05-07 ENCOUNTER — Ambulatory Visit (INDEPENDENT_AMBULATORY_CARE_PROVIDER_SITE_OTHER): Admitting: Physician Assistant

## 2024-06-02 ENCOUNTER — Ambulatory Visit: Payer: Self-pay | Admitting: Physician Assistant

## 2024-06-05 ENCOUNTER — Ambulatory Visit (INDEPENDENT_AMBULATORY_CARE_PROVIDER_SITE_OTHER): Payer: Self-pay | Admitting: Physician Assistant
